# Patient Record
Sex: Female | Born: 1945 | Race: Black or African American | Hispanic: No | Smoking: Former smoker
Health system: Southern US, Community
[De-identification: ages and names within clinical notes are randomized; demographics above are authoritative.]

## PROBLEM LIST (undated history)

## (undated) DIAGNOSIS — I10 Essential (primary) hypertension: Secondary | ICD-10-CM

## (undated) DIAGNOSIS — K7460 Unspecified cirrhosis of liver: Secondary | ICD-10-CM

## (undated) HISTORY — PX: BLADDER SURGERY: SHX569

## (undated) HISTORY — PX: COLONOSCOPY: SHX174

---

## 1997-09-13 ENCOUNTER — Other Ambulatory Visit: Admission: RE | Admit: 1997-09-13 | Discharge: 1997-09-13 | Payer: Self-pay | Admitting: Radiology

## 1998-04-27 ENCOUNTER — Other Ambulatory Visit: Admission: RE | Admit: 1998-04-27 | Discharge: 1998-04-27 | Payer: Self-pay | Admitting: Obstetrics and Gynecology

## 1999-03-24 ENCOUNTER — Ambulatory Visit (HOSPITAL_COMMUNITY): Admission: RE | Admit: 1999-03-24 | Discharge: 1999-03-24 | Payer: Self-pay | Admitting: Internal Medicine

## 1999-03-24 ENCOUNTER — Encounter (INDEPENDENT_AMBULATORY_CARE_PROVIDER_SITE_OTHER): Payer: Self-pay | Admitting: Specialist

## 1999-06-23 ENCOUNTER — Encounter (INDEPENDENT_AMBULATORY_CARE_PROVIDER_SITE_OTHER): Payer: Self-pay | Admitting: Specialist

## 1999-06-23 ENCOUNTER — Other Ambulatory Visit: Admission: RE | Admit: 1999-06-23 | Discharge: 1999-06-23 | Payer: Self-pay | Admitting: Internal Medicine

## 1999-09-27 ENCOUNTER — Other Ambulatory Visit: Admission: RE | Admit: 1999-09-27 | Discharge: 1999-09-27 | Payer: Self-pay | Admitting: Obstetrics and Gynecology

## 2000-08-22 ENCOUNTER — Other Ambulatory Visit: Admission: RE | Admit: 2000-08-22 | Discharge: 2000-08-22 | Payer: Self-pay | Admitting: Obstetrics and Gynecology

## 2001-09-25 ENCOUNTER — Other Ambulatory Visit: Admission: RE | Admit: 2001-09-25 | Discharge: 2001-09-25 | Payer: Self-pay | Admitting: Obstetrics and Gynecology

## 2003-05-10 ENCOUNTER — Other Ambulatory Visit: Admission: RE | Admit: 2003-05-10 | Discharge: 2003-05-10 | Payer: Self-pay | Admitting: Obstetrics and Gynecology

## 2005-12-31 ENCOUNTER — Ambulatory Visit: Payer: Self-pay | Admitting: Internal Medicine

## 2006-01-29 ENCOUNTER — Encounter: Payer: Self-pay | Admitting: Internal Medicine

## 2006-01-29 ENCOUNTER — Ambulatory Visit: Payer: Self-pay | Admitting: Internal Medicine

## 2006-09-18 ENCOUNTER — Encounter: Admission: RE | Admit: 2006-09-18 | Discharge: 2006-09-18 | Payer: Self-pay | Admitting: Gastroenterology

## 2006-10-09 ENCOUNTER — Encounter (INDEPENDENT_AMBULATORY_CARE_PROVIDER_SITE_OTHER): Payer: Self-pay | Admitting: Specialist

## 2006-10-09 ENCOUNTER — Ambulatory Visit (HOSPITAL_COMMUNITY): Admission: RE | Admit: 2006-10-09 | Discharge: 2006-10-09 | Payer: Self-pay | Admitting: Gastroenterology

## 2006-10-30 ENCOUNTER — Encounter: Admission: RE | Admit: 2006-10-30 | Discharge: 2006-10-30 | Payer: Self-pay | Admitting: Gastroenterology

## 2008-10-28 ENCOUNTER — Encounter: Admission: RE | Admit: 2008-10-28 | Discharge: 2008-10-28 | Payer: Self-pay | Admitting: Gastroenterology

## 2010-06-25 ENCOUNTER — Encounter: Payer: Self-pay | Admitting: Gastroenterology

## 2011-02-08 ENCOUNTER — Encounter: Payer: Self-pay | Admitting: Internal Medicine

## 2011-05-15 ENCOUNTER — Emergency Department (HOSPITAL_COMMUNITY)
Admission: EM | Admit: 2011-05-15 | Discharge: 2011-05-15 | Disposition: A | Discharge status: Home / Self Care | Payer: No Typology Code available for payment source | Attending: Emergency Medicine | Admitting: Emergency Medicine

## 2011-05-15 ENCOUNTER — Emergency Department (HOSPITAL_COMMUNITY): Payer: No Typology Code available for payment source

## 2011-05-15 ENCOUNTER — Encounter: Payer: Self-pay | Admitting: *Deleted

## 2011-05-15 DIAGNOSIS — M25519 Pain in unspecified shoulder: Secondary | ICD-10-CM | POA: Insufficient documentation

## 2011-05-15 DIAGNOSIS — S0083XA Contusion of other part of head, initial encounter: Secondary | ICD-10-CM

## 2011-05-15 DIAGNOSIS — I10 Essential (primary) hypertension: Secondary | ICD-10-CM | POA: Insufficient documentation

## 2011-05-15 DIAGNOSIS — M549 Dorsalgia, unspecified: Secondary | ICD-10-CM | POA: Insufficient documentation

## 2011-05-15 DIAGNOSIS — R0602 Shortness of breath: Secondary | ICD-10-CM | POA: Insufficient documentation

## 2011-05-15 DIAGNOSIS — S161XXA Strain of muscle, fascia and tendon at neck level, initial encounter: Secondary | ICD-10-CM

## 2011-05-15 DIAGNOSIS — S0990XA Unspecified injury of head, initial encounter: Secondary | ICD-10-CM

## 2011-05-15 DIAGNOSIS — M542 Cervicalgia: Secondary | ICD-10-CM | POA: Insufficient documentation

## 2011-05-15 DIAGNOSIS — R51 Headache: Secondary | ICD-10-CM | POA: Insufficient documentation

## 2011-05-15 DIAGNOSIS — R079 Chest pain, unspecified: Secondary | ICD-10-CM | POA: Insufficient documentation

## 2011-05-15 HISTORY — DX: Essential (primary) hypertension: I10

## 2011-05-15 MED ORDER — HYDROCODONE-ACETAMINOPHEN 5-500 MG PO TABS: 1.0000 | ORAL_TABLET | Freq: Four times a day (QID) | ORAL | Status: AC | PRN

## 2011-05-15 NOTE — ED Notes (Signed)
[id number]<VW> Expected date:<BR> Expected time:<BR> Means of arrival:<BR> Comments:<BR> Ems/ fall. lsb
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
## 2011-11-30 ENCOUNTER — Other Ambulatory Visit: Payer: Self-pay | Admitting: Gastroenterology

## 2011-11-30 DIAGNOSIS — K7460 Unspecified cirrhosis of liver: Secondary | ICD-10-CM

## 2011-11-30 DIAGNOSIS — K759 Inflammatory liver disease, unspecified: Secondary | ICD-10-CM

## 2011-11-30 DIAGNOSIS — IMO0002 Reserved for concepts with insufficient information to code with codable children: Secondary | ICD-10-CM

## 2011-12-11 ENCOUNTER — Ambulatory Visit
Admission: RE | Admit: 2011-12-11 | Discharge: 2011-12-11 | Disposition: A | Discharge status: Home / Self Care | Payer: No Typology Code available for payment source | Source: Ambulatory Visit | Attending: Gastroenterology | Admitting: Gastroenterology

## 2011-12-11 DIAGNOSIS — IMO0002 Reserved for concepts with insufficient information to code with codable children: Secondary | ICD-10-CM

## 2012-03-05 ENCOUNTER — Encounter: Payer: Self-pay | Admitting: Internal Medicine

## 2012-07-23 ENCOUNTER — Other Ambulatory Visit: Payer: Self-pay | Admitting: Gastroenterology

## 2012-07-23 DIAGNOSIS — K7460 Unspecified cirrhosis of liver: Secondary | ICD-10-CM

## 2012-07-29 ENCOUNTER — Other Ambulatory Visit: Payer: Medicare Other

## 2012-08-05 ENCOUNTER — Other Ambulatory Visit: Payer: Medicare Other

## 2012-08-12 ENCOUNTER — Ambulatory Visit
Admission: RE | Admit: 2012-08-12 | Discharge: 2012-08-12 | Disposition: A | Discharge status: Home / Self Care | Payer: Medicare Other | Source: Ambulatory Visit | Attending: Gastroenterology | Admitting: Gastroenterology

## 2012-08-12 DIAGNOSIS — K7460 Unspecified cirrhosis of liver: Secondary | ICD-10-CM

## 2014-10-18 ENCOUNTER — Other Ambulatory Visit: Payer: Self-pay | Admitting: Family Medicine

## 2014-10-18 ENCOUNTER — Ambulatory Visit
Admission: RE | Admit: 2014-10-18 | Discharge: 2014-10-18 | Disposition: A | Discharge status: Home / Self Care | Payer: Medicare Other | Source: Ambulatory Visit | Attending: Family Medicine | Admitting: Family Medicine

## 2014-10-18 DIAGNOSIS — R1012 Left upper quadrant pain: Secondary | ICD-10-CM

## 2014-11-29 ENCOUNTER — Other Ambulatory Visit: Payer: Self-pay | Admitting: Gastroenterology

## 2014-11-29 DIAGNOSIS — K7460 Unspecified cirrhosis of liver: Secondary | ICD-10-CM

## 2014-12-03 ENCOUNTER — Other Ambulatory Visit: Payer: Medicare Other

## 2014-12-07 ENCOUNTER — Ambulatory Visit
Admission: RE | Admit: 2014-12-07 | Discharge: 2014-12-07 | Disposition: A | Discharge status: Home / Self Care | Payer: Medicare Other | Source: Ambulatory Visit | Attending: Gastroenterology | Admitting: Gastroenterology

## 2014-12-07 DIAGNOSIS — K7460 Unspecified cirrhosis of liver: Secondary | ICD-10-CM

## 2017-04-30 ENCOUNTER — Other Ambulatory Visit: Payer: Self-pay | Admitting: Gastroenterology

## 2017-04-30 DIAGNOSIS — K743 Primary biliary cirrhosis: Secondary | ICD-10-CM

## 2017-05-17 ENCOUNTER — Ambulatory Visit
Admission: RE | Admit: 2017-05-17 | Discharge: 2017-05-17 | Disposition: A | Discharge status: Home / Self Care | Payer: Medicare Other | Source: Ambulatory Visit | Attending: Gastroenterology | Admitting: Gastroenterology

## 2017-05-17 DIAGNOSIS — K743 Primary biliary cirrhosis: Secondary | ICD-10-CM

## 2018-01-04 ENCOUNTER — Ambulatory Visit (HOSPITAL_COMMUNITY)
Admission: EM | Admit: 2018-01-04 | Discharge: 2018-01-04 | Disposition: A | Discharge status: Home / Self Care | Payer: Medicare Other | Attending: Family Medicine | Admitting: Family Medicine

## 2018-01-04 ENCOUNTER — Other Ambulatory Visit: Payer: Self-pay

## 2018-01-04 ENCOUNTER — Encounter (HOSPITAL_COMMUNITY): Payer: Self-pay | Admitting: Emergency Medicine

## 2018-01-04 DIAGNOSIS — R51 Headache: Secondary | ICD-10-CM

## 2018-01-04 DIAGNOSIS — R202 Paresthesia of skin: Secondary | ICD-10-CM

## 2018-01-04 MED ORDER — NAPROXEN 500 MG PO TABS: 500.0000 mg | ORAL_TABLET | Freq: Two times a day (BID) | ORAL | 0 refills | Status: DC
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
## 2018-01-04 NOTE — Discharge Instructions (Signed)
No alarming signs on your exam. As discussed, xray will not show anything to your brain, and CT scan is to help determine if there is any brain bleed. You do not exhibit any symptoms that is worrisome for a brain bleed. Start naproxen as directed. This will help with inflammation, which can help with the tingling sensation to the back of the head. Ice compress, rest. Follow up with your PCP as scheduled for further evaluation needed.  Back  If experience numbness/tingling of the inner thighs, loss of bladder or bowel control, go to the emergency department for evaluation.   Head If experiencing worsening of symptoms, headache/blurry vision, nausea/vomiting, confusion/altered mental status, dizziness, weakness, passing out, imbalance, go to the emergency department for further evaluation.

## 2018-01-10 ENCOUNTER — Other Ambulatory Visit: Payer: Self-pay

## 2018-01-10 ENCOUNTER — Ambulatory Visit (HOSPITAL_COMMUNITY)
Admission: EM | Admit: 2018-01-10 | Discharge: 2018-01-10 | Disposition: A | Discharge status: Home / Self Care | Payer: Medicare Other | Attending: Family Medicine | Admitting: Family Medicine

## 2018-01-10 ENCOUNTER — Ambulatory Visit (INDEPENDENT_AMBULATORY_CARE_PROVIDER_SITE_OTHER): Payer: Medicare Other

## 2018-01-10 ENCOUNTER — Encounter (HOSPITAL_COMMUNITY): Payer: Self-pay

## 2018-01-10 DIAGNOSIS — R05 Cough: Secondary | ICD-10-CM

## 2018-01-10 DIAGNOSIS — I10 Essential (primary) hypertension: Secondary | ICD-10-CM

## 2018-01-10 DIAGNOSIS — K743 Primary biliary cirrhosis: Secondary | ICD-10-CM

## 2018-01-10 DIAGNOSIS — R059 Cough, unspecified: Secondary | ICD-10-CM

## 2018-01-10 DIAGNOSIS — H409 Unspecified glaucoma: Secondary | ICD-10-CM

## 2018-01-10 MED ORDER — BENZONATATE 200 MG PO CAPS: 200.0000 mg | ORAL_CAPSULE | Freq: Two times a day (BID) | ORAL | 0 refills | Status: DC | PRN

## 2018-01-10 MED ORDER — NAPROXEN 500 MG PO TABS: 500.0000 mg | ORAL_TABLET | Freq: Two times a day (BID) | ORAL | 0 refills | Status: DC

## 2018-01-10 NOTE — ED Triage Notes (Signed)
Coughing up white mucus. Left side flank pain. 4 days
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
## 2018-01-10 NOTE — Discharge Instructions (Addendum)
Drink plenty of fluids May continue Mucinex DM Take Tessalon twice a day for cough Take the naproxen 2 times a day as needed for the rib pain Take the naprosyn with food Drink plenty of water

## 2018-01-26 ENCOUNTER — Other Ambulatory Visit: Payer: Self-pay | Admitting: Family Medicine

## 2018-08-01 ENCOUNTER — Encounter: Payer: Self-pay | Admitting: Podiatry

## 2018-08-01 ENCOUNTER — Ambulatory Visit: Payer: Medicare Other | Admitting: Podiatry

## 2018-08-01 ENCOUNTER — Ambulatory Visit (INDEPENDENT_AMBULATORY_CARE_PROVIDER_SITE_OTHER): Payer: Medicare Other

## 2018-08-01 VITALS — BP 121/73

## 2018-08-01 DIAGNOSIS — M21619 Bunion of unspecified foot: Secondary | ICD-10-CM | POA: Diagnosis not present

## 2018-08-01 DIAGNOSIS — M2042 Other hammer toe(s) (acquired), left foot: Secondary | ICD-10-CM

## 2018-08-01 DIAGNOSIS — M2041 Other hammer toe(s) (acquired), right foot: Secondary | ICD-10-CM

## 2018-08-01 NOTE — Patient Instructions (Addendum)
Hammer Toe  Hammer toe is a change in the shape (a deformity) of your toe. The deformity causes the middle joint of your toe to stay bent. This causes pain, especially when you are wearing shoes. Hammer toe starts gradually. At first, the toe can be straightened. Gradually over time, the deformity becomes stiff and permanent. Early treatments to keep the toe straight may relieve pain. As the deformity becomes stiff and permanent, surgery may be needed to straighten the toe. What are the causes? Hammer toe is caused by abnormal bending of the toe joint that is closest to your foot. It happens gradually over time. This pulls on the muscles and connections (tendons) of the toe joint, making them weak and stiff. It is often related to wearing shoes that are too short or narrow and do not let your toes straighten. What increases the risk? You may be at greater risk for hammer toe if you:  Are female.  Are older.  Wear shoes that are too small.  Wear high-heeled shoes that pinch your toes.  Are a ballet dancer.  Have a second toe that is longer than your big toe (first toe).  Injure your foot or toe.  Have arthritis.  Have a family history of hammer toe.  Have a nerve or muscle disorder. What are the signs or symptoms? The main symptoms of this condition are pain and deformity of the toe. The pain is worse when wearing shoes, walking, or running. Other symptoms may include:  Corns or calluses over the bent part of the toe or between the toes.  Redness and a burning feeling on the toe.  An open sore that forms on the top of the toe.  Not being able to straighten the toe. How is this diagnosed? This condition is diagnosed based on your symptoms and a physical exam. During the exam, your health care provider will try to straighten your toe to see how stiff the deformity is. You may also have tests, such as:  A blood test to check for rheumatoid arthritis.  An X-ray to show how  severe the deformity is. How is this treated? Treatment for this condition will depend on how stiff the deformity is. Surgery is often needed. However, sometimes a hammer toe can be straightened without surgery. Treatments that do not involve surgery include:  Taping the toe into a straightened position.  Using pads and cushions to protect the toe (orthotics).  Wearing shoes that provide enough room for the toes.  Doing toe-stretching exercises at home.  Taking an NSAID to reduce pain and swelling. If these treatments do not help or the toe cannot be straightened, surgery is the next option. The most common surgeries used to straighten a hammer toe include:  Arthroplasty. In this procedure, part of the joint is removed, and that allows the toe to straighten.  Fusion. In this procedure, cartilage between the two bones of the joint is taken out and the bones are fused together into one longer bone.  Implantation. In this procedure, part of the bone is removed and replaced with an implant to let the toe move again.  Flexor tendon transfer. In this procedure, the tendons that curl the toes down (flexor tendons) are repositioned. Follow these instructions at home:  Take over-the-counter and prescription medicines only as told by your health care provider.  Do toe straightening and stretching exercises as told by your health care provider.  Keep all follow-up visits as told by your health care   provider. This is important. How is this prevented?  Wear shoes that give your toes enough room and do not cause pain.  Do not wear high-heeled shoes. Contact a health care provider if:  Your pain gets worse.  Your toe becomes red or swollen.  You develop an open sore on your toe. This information is not intended to replace advice given to you by your health care provider. Make sure you discuss any questions you have with your health care provider. Document Released: 05/18/2000 Document  Revised: 12/17/2016 Document Reviewed: 09/14/2015 Elsevier Interactive Patient Education  2019 [employer] Bunion  A bunion is a bump on the base of the big toe that forms when the bones of the big toe joint move out of position. Bunions may be small at first, but they often get larger over time. They can make walking painful. What are the causes? A bunion may be caused by:  Wearing narrow or pointed shoes that force the big toe to press against the other toes.  Abnormal foot development that causes the foot to roll inward (pronate).  Changes in the foot that are caused by certain diseases, such as rheumatoid arthritis or polio.  A foot injury. What increases the risk? The following factors may make you more likely to develop this condition:  Wearing shoes that squeeze the toes together.  Having certain diseases, such as: ? Rheumatoid arthritis. ? Polio. ? Cerebral palsy.  Having family members who have bunions.  Being born with a foot deformity, such as flat feet or low arches.  Doing activities that put a lot of pressure on the feet, such as ballet dancing. What are the signs or symptoms? The main symptom of a bunion is a noticeable bump on the big toe. Other symptoms may include:  Pain.  Swelling around the big toe.  Redness and inflammation.  Thick or hardened skin on the big toe or between the toes.  Stiffness or loss of motion in the big toe.  Trouble with walking. How is this diagnosed? A bunion may be diagnosed based on your symptoms, medical history, and activities. You may have tests, such as:  X-rays. These allow your health care provider to check the position of the bones in your foot and look for damage to your joint. They also help your health care provider determine the severity of your bunion and the best way to treat it.  Joint aspiration. In this test, a sample of fluid is removed from the toe joint. This test may be done if you are in a lot of  pain. It helps rule out diseases that cause painful swelling of the joints, such as arthritis. How is this treated? Treatment depends on the severity of your symptoms. The goal of treatment is to relieve symptoms and prevent the bunion from getting worse. Your health care provider may recommend:  Wearing shoes that have a wide toe box.  Using bunion pads to cushion the affected area.  Taping your toes together to keep them in a normal position.  Placing a device inside your shoe (orthotics) to help reduce pressure on your toe joint.  Taking medicine to ease pain, inflammation, and swelling.  Applying heat or ice to the affected area.  Doing stretching exercises.  Surgery to remove scar tissue and move the toes back into their normal position. This treatment is rare. Follow these instructions at home: Managing pain, stiffness, and swelling   If directed, put ice on the painful area: ?   Put ice in a plastic bag. ? Place a towel between your skin and the bag. ? Leave the ice on for 20 minutes, 2-3 times a day. Activity   If directed, apply heat to the affected area before you exercise. Use the heat source that your health care provider recommends, such as a moist heat pack or a heating pad. ? Place a towel between your skin and the heat source. ? Leave the heat on for 20-30 minutes. ? Remove the heat if your skin turns bright red. This is especially important if you are unable to feel pain, heat, or cold. You may have a greater risk of getting burned.  Do exercises as told by your health care provider. General instructions  Support your toe joint with proper footwear, shoe padding, or taping as told by your health care provider.  Take over-the-counter and prescription medicines only as told by your health care provider.  Keep all follow-up visits as told by your health care provider. This is important. Contact a health care provider if your symptoms:  Get worse.  Do not  improve in 2 weeks. Get help right away if you have:  Severe pain and trouble with walking. Summary  A bunion is a bump on the base of the big toe that forms when the bones of the big toe joint move out of position.  Bunions can make walking painful.  Treatment depends on the severity of your symptoms.  Support your toe joint with proper footwear, shoe padding, or taping as told by your health care provider. This information is not intended to replace advice given to you by your health care provider. Make sure you discuss any questions you have with your health care provider. Document Released: 05/21/2005 Document Revised: 10/01/2017 Document Reviewed: 10/01/2017 Elsevier Interactive Patient Education  2019 [employer]  

## 2018-08-02 NOTE — Progress Notes (Signed)
Subjective:   Patient ID: Stacey Harmon, female   DOB: 73 y.o.   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  Patient states is her hereditary and she is tried toe covers and shoe gear modification.  Patient does not smoke and likes to be active   Review of Systems  All other systems reviewed and are negative.       Objective:  Physical Exam Vitals signs and nursing note reviewed.  Constitutional:      Appearance: She is well-developed.  Pulmonary:     Effort: Pulmonary effort is normal.  Musculoskeletal: Normal range of motion.  Skin:    General: Skin is warm.  Neurological:     Mental Status: She is alert.     Neurovascular status intact muscle strength is adequate range of motion within normal limits with patient noted to have significant structural foot malalignment right with rigid contracture digits 2 and structural bunion deformity that is painful with palpation.  Patient is noted to have good digital perfusion and is well oriented x3     Assessment:  Significant structural deformity of the right foot with bunion deformity hammertoe deformity and moderate symptomatology     Plan:  H&P x-rays reviewed and discussed I would like to avoid surgery on her and I have recommended as possible digital fusion if necessary along with structural bunion correction we will not be able to get it completely corrected.  Patient wants to go this route at one point in future but at this time we will get a try conservative padding cushioning of the area and patient will be seen back depending on how symptoms do with conservative treatment  X-rays indicate there is significant structural bunion deformity with rigid contracture of the digit and bunion deformity noted

## 2018-10-08 ENCOUNTER — Ambulatory Visit: Payer: Medicare Other | Admitting: Podiatry

## 2018-10-08 ENCOUNTER — Encounter: Payer: Self-pay | Admitting: Podiatry

## 2018-10-08 ENCOUNTER — Ambulatory Visit (INDEPENDENT_AMBULATORY_CARE_PROVIDER_SITE_OTHER): Payer: Medicare Other

## 2018-10-08 ENCOUNTER — Other Ambulatory Visit: Payer: Self-pay

## 2018-10-08 VITALS — Temp 97.9°F

## 2018-10-08 DIAGNOSIS — S9031XA Contusion of right foot, initial encounter: Secondary | ICD-10-CM

## 2018-10-08 DIAGNOSIS — R600 Localized edema: Secondary | ICD-10-CM

## 2018-10-08 DIAGNOSIS — S92351A Displaced fracture of fifth metatarsal bone, right foot, initial encounter for closed fracture: Secondary | ICD-10-CM | POA: Diagnosis not present

## 2018-10-09 NOTE — Progress Notes (Signed)
Subjective:   Patient ID: Stacey Harmon, female   DOB: 73 y.o.   MRN: [id number]   HPI Patient presents stating she slipped on a curb on her right foot and felt a snap in her foot and it is been very swollen and painful.  States it is impossible for her to bear weight and she feels unstable   ROS      Objective:  Physical Exam  Neurovascular status intact with exquisite discomfort on the lateral side of the right foot base of fifth metatarsal with swelling noted around the entire area and negative Homans sign noted.  It is very tender when palpated it     Assessment:  Probability for fracture of the base of the fifth metatarsal right with edema of 8+2 pitting noted     Plan:  H&P x-rays reviewed and today applied Unna boot to reduce swelling along with Ace wrap instructed her to wear this for approximately 3 days but take it off earlier if needed and I applied air fracture walker to immobilize.  Patient will wear this and be seen back 3 weeks and will use crutches as needed  X-rays indicate fracture of the base of the fifth metatarsal right that is not Jones but more a evulsion

## 2018-10-29 ENCOUNTER — Other Ambulatory Visit: Payer: Self-pay

## 2018-10-29 ENCOUNTER — Ambulatory Visit (INDEPENDENT_AMBULATORY_CARE_PROVIDER_SITE_OTHER): Payer: Medicare Other

## 2018-10-29 ENCOUNTER — Encounter: Payer: Self-pay | Admitting: Podiatry

## 2018-10-29 ENCOUNTER — Ambulatory Visit: Payer: Medicare Other | Admitting: Podiatry

## 2018-10-29 VITALS — Temp 97.3°F

## 2018-10-29 DIAGNOSIS — S92351A Displaced fracture of fifth metatarsal bone, right foot, initial encounter for closed fracture: Secondary | ICD-10-CM

## 2018-10-29 DIAGNOSIS — M21619 Bunion of unspecified foot: Secondary | ICD-10-CM | POA: Diagnosis not present

## 2018-10-29 DIAGNOSIS — M2042 Other hammer toe(s) (acquired), left foot: Secondary | ICD-10-CM | POA: Diagnosis not present

## 2018-10-29 DIAGNOSIS — M2041 Other hammer toe(s) (acquired), right foot: Secondary | ICD-10-CM

## 2018-10-29 NOTE — Progress Notes (Signed)
Subjective:   Patient ID: Stacey Harmon, female   DOB: 73 y.o.   MRN: [id number]   HPI Patient states still having swelling and pain stating that the boot is helping her and it seems to gradually be improving   ROS      Objective:  Physical Exam  Neurovascular status intact with patient's right lateral fifth metatarsal showing swelling still present with moderate improvement from previous     Assessment:  Appears to be improving from fracture of the base of the fifth metatarsal right     Plan:  H&P discussed condition recommended continued immobilization reviewed x-rays and compression along with ice therapy.  Gradual reduction of boot starting the next 2 weeks with recovery hopefully occurring in the next 6 to 8 weeks  X-rays indicate fracture of the base of fifth metatarsal right which appears to be healing but still has a ways to go

## 2020-05-12 ENCOUNTER — Encounter: Payer: Self-pay | Admitting: Podiatry

## 2020-05-12 ENCOUNTER — Ambulatory Visit: Payer: Medicare Other | Admitting: Podiatry

## 2020-05-12 ENCOUNTER — Other Ambulatory Visit: Payer: Self-pay

## 2020-05-12 ENCOUNTER — Ambulatory Visit (INDEPENDENT_AMBULATORY_CARE_PROVIDER_SITE_OTHER): Payer: Medicare Other

## 2020-05-12 DIAGNOSIS — M2042 Other hammer toe(s) (acquired), left foot: Secondary | ICD-10-CM | POA: Diagnosis not present

## 2020-05-12 DIAGNOSIS — M2041 Other hammer toe(s) (acquired), right foot: Secondary | ICD-10-CM | POA: Diagnosis not present

## 2020-05-12 DIAGNOSIS — M779 Enthesopathy, unspecified: Secondary | ICD-10-CM | POA: Diagnosis not present

## 2020-05-12 DIAGNOSIS — S9032XA Contusion of left foot, initial encounter: Secondary | ICD-10-CM | POA: Diagnosis not present

## 2020-05-12 MED ORDER — TRIAMCINOLONE ACETONIDE 10 MG/ML IJ SUSP
10.0000 mg | Freq: Once | INTRAMUSCULAR | Status: AC
  Administered 2020-05-12: 10 mg

## 2020-05-13 NOTE — Progress Notes (Signed)
Subjective:   Patient ID: Stacey Harmon, female   DOB: 74 y.o.   MRN: [id number]   HPI Patient presents stating that she has concerns about pain in her arch of both feet and she states that she traumatized her left foot and she wants to have it checked.   ROS      Objective:  Physical Exam  Neurovascular status intact with patient noted to have contusion of the left foot with bruising of the arch moderate flatfoot deformity and digital deformities bilateral with keratotic lesion formation that is moderately painful     Assessment:  Fasciitis-like symptoms trauma with contusion and digital deformities     Plan:  H&P reviewed condition.  Upon extensive questioning it appears to be improving so I recommended that we just utilize over-the-counter insoles topical medicine and support along with soaks and the hammertoe deformity we discussed and I do not recommend current correction and would recommend wide toe box and softer materials.  Patient will be seen back to recheck if symptoms do not improve over the next 2 to 3 weeks  X-rays indicate that there is no indications of fracture of the foot with moderate depression of the arch

## 2020-12-21 ENCOUNTER — Other Ambulatory Visit: Payer: Self-pay | Admitting: Gastroenterology

## 2020-12-21 DIAGNOSIS — K743 Primary biliary cirrhosis: Secondary | ICD-10-CM

## 2021-01-04 DIAGNOSIS — W01198A Fall on same level from slipping, tripping and stumbling with subsequent striking against other object, initial encounter: Secondary | ICD-10-CM | POA: Diagnosis not present

## 2021-01-04 DIAGNOSIS — R918 Other nonspecific abnormal finding of lung field: Secondary | ICD-10-CM | POA: Insufficient documentation

## 2021-01-04 DIAGNOSIS — I10 Essential (primary) hypertension: Secondary | ICD-10-CM | POA: Insufficient documentation

## 2021-01-04 DIAGNOSIS — R0789 Other chest pain: Secondary | ICD-10-CM | POA: Diagnosis present

## 2021-01-04 DIAGNOSIS — M25562 Pain in left knee: Secondary | ICD-10-CM | POA: Insufficient documentation

## 2021-01-04 DIAGNOSIS — M25561 Pain in right knee: Secondary | ICD-10-CM | POA: Insufficient documentation

## 2021-01-04 DIAGNOSIS — Z87891 Personal history of nicotine dependence: Secondary | ICD-10-CM | POA: Diagnosis not present

## 2021-01-04 DIAGNOSIS — Z79899 Other long term (current) drug therapy: Secondary | ICD-10-CM | POA: Diagnosis not present

## 2021-01-05 ENCOUNTER — Emergency Department (HOSPITAL_COMMUNITY): Payer: Medicare Other

## 2021-01-05 ENCOUNTER — Other Ambulatory Visit: Payer: Self-pay

## 2021-01-05 ENCOUNTER — Encounter (HOSPITAL_COMMUNITY): Payer: Self-pay

## 2021-01-05 ENCOUNTER — Emergency Department (HOSPITAL_COMMUNITY)
Admission: EM | Admit: 2021-01-05 | Discharge: 2021-01-05 | Disposition: A | Discharge status: Home / Self Care | Payer: Medicare Other | Attending: Emergency Medicine | Admitting: Emergency Medicine

## 2021-01-05 DIAGNOSIS — R918 Other nonspecific abnormal finding of lung field: Secondary | ICD-10-CM

## 2021-01-05 DIAGNOSIS — M25562 Pain in left knee: Secondary | ICD-10-CM

## 2021-01-05 DIAGNOSIS — M25561 Pain in right knee: Secondary | ICD-10-CM

## 2021-01-05 DIAGNOSIS — R0789 Other chest pain: Secondary | ICD-10-CM

## 2021-01-05 LAB — BASIC METABOLIC PANEL
Anion gap: 10 (ref 5–15)
BUN: 14 mg/dL (ref 8–23)
CO2: 24 mmol/L (ref 22–32)
Calcium: 9.1 mg/dL (ref 8.9–10.3)
Chloride: 106 mmol/L (ref 98–111)
Creatinine, Ser: 0.58 mg/dL (ref 0.44–1.00)
GFR, Estimated: >60 mL/min (ref >60)
Glucose, Bld: 99 mg/dL (ref 70–99)
Potassium: 3.9 mmol/L (ref 3.5–5.1)
Sodium: 140 mmol/L (ref 135–145)

## 2021-01-05 LAB — CBC WITH DIFFERENTIAL/PLATELET
Abs Immature Granulocytes: 0.03 10*3/uL (ref 0.00–0.07)
Basophils Absolute: 0 10*3/uL (ref 0.0–0.1)
Basophils Relative: 0 %
Eosinophils Absolute: 0.1 10*3/uL (ref 0.0–0.5)
Eosinophils Relative: 1 %
HCT: 35.2 % — ABNORMAL LOW (ref 36.0–46.0)
Hemoglobin: 11.7 g/dL — ABNORMAL LOW (ref 12.0–15.0)
Immature Granulocytes: 0 %
Lymphocytes Relative: 13 %
Lymphs Abs: 1.2 10*3/uL (ref 0.7–4.0)
MCH: 33.7 pg (ref 26.0–34.0)
MCHC: 33.2 g/dL (ref 30.0–36.0)
MCV: 101.4 fL — ABNORMAL HIGH (ref 80.0–100.0)
Monocytes Absolute: 0.9 10*3/uL (ref 0.1–1.0)
Monocytes Relative: 10 %
Neutro Abs: 7 10*3/uL (ref 1.7–7.7)
Neutrophils Relative %: 76 %
Platelets: 260 10*3/uL (ref 150–400)
RBC: 3.47 MIL/uL — ABNORMAL LOW (ref 3.87–5.11)
RDW: 14.7 % (ref 11.5–15.5)
WBC: 9.3 10*3/uL (ref 4.0–10.5)
nRBC: 0 % (ref 0.0–0.2)

## 2021-01-05 MED ORDER — OXYCODONE-ACETAMINOPHEN 5-325 MG PO TABS
1.0000 | ORAL_TABLET | Freq: Four times a day (QID) | ORAL | 0 refills | Status: DC | PRN
Start: 2021-01-05 — End: 2021-01-12

## 2021-01-05 MED ORDER — OXYCODONE-ACETAMINOPHEN 5-325 MG PO TABS
1.0000 | ORAL_TABLET | Freq: Once | ORAL | Status: AC
Start: 2021-01-05 — End: 2021-01-05
  Administered 2021-01-05: 1 via ORAL
  Filled 2021-01-05: qty 1

## 2021-01-05 MED ORDER — IOHEXOL 350 MG/ML SOLN
100.0000 mL | Freq: Once | INTRAVENOUS | Status: AC | PRN
  Administered 2021-01-05: 75 mL via INTRAVENOUS

## 2021-01-05 NOTE — ED Provider Notes (Signed)
[facility] DEPT Provider Note   CSN: [id number] Arrival date & time: 01/04/21  2352     History Chief Complaint  Patient presents with   Fall   Rib Injury   Knee Pain    Stacey Harmon is a 75 y.o. female.  HPI     This is a 75 year old female with a history of hypertension who presents with chest wall pain and knee pain.  Patient reports that she tripped and fell yesterday.  She states she fell on a tile floor.  She has had progressively worsening anterior chest discomfort and bilateral knee pain.  She has been ambulatory.  She states she has to sit forward to be comfortable with breathing.  She rates her discomfort as 6 out of 10.  No abdominal pain, nausea, vomiting.  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 No pertinent surgical history.   OB History   No obstetric history on file.     History reviewed. No pertinent family history.  Social History   Tobacco Use   Smoking status: Former   Smokeless tobacco: Former  Substance Use Topics   Alcohol use: Never   Drug use: Never    Home Medications Prior to Admission medications   Medication Sig Start Date End Date Taking? Authorizing Provider  oxyCODONE-acetaminophen (PERCOCET/ROXICET) 5-325 MG tablet Take 1 tablet by mouth every 6 (six) hours as needed for severe pain. 01/05/21  Yes Franny Selvage, Barbette Hair, MD  ALPRAZolam (XANAX XR) 0.5 MG 24 hr tablet Take 0.5 mg by mouth at bedtime.      [provider]  ALPRAZolam Duanne Moron) 0.5 MG tablet TK 1 T PO  TID 07/08/18   [provider]  amLODipine (NORVASC) 10 MG tablet Take 10 mg by mouth daily.      [provider]  azaTHIOprine (IMURAN) 50 MG tablet Take 125 mg by mouth daily.     [provider]  benzonatate  (TESSALON) 200 MG capsule Take 1 capsule (200 mg total) by mouth 2 (two) times daily as needed for cough. 01/10/18   Raylene Everts, MD  brimonidine-timolol (COMBIGAN) 0.2-0.5 % ophthalmic solution Place 1 drop into both eyes every 12 (twelve) hours.    [provider]  clindamycin (CLEOCIN) 300 MG capsule Take 300 mg by mouth every 8 (eight) hours. 02/29/20   [provider]  flavoxATE (URISPAS) 100 MG tablet Take 100 mg by mouth 3 (three) times daily. 11/17/19   [provider]  latanoprost (XALATAN) 0.005 % ophthalmic solution Place 1 drop into both eyes at bedtime.      [provider]  Multiple Vitamins-Calcium (ONE-A-DAY WOMENS PO) Take 1 tablet by mouth daily.      [provider]  naproxen (NAPROSYN) 500 MG tablet Take 1 tablet (500 mg total) by mouth 2 (two) times daily. 01/10/18   Raylene Everts, MD  NYSTATIN powder Apply topically 3 (three) times daily. 11/23/19   [provider]  predniSONE (DELTASONE) 10 MG tablet Take 10 mg by mouth daily.      [provider]  triamterene-hydrochlorothiazide (MAXZIDE-25) 37.5-25 MG per tablet Take 2 tablets by mouth daily.      [provider]    Allergies    Penicillins  Review of Systems  Review of Systems  Constitutional:  Negative for fever.  Respiratory:  Positive for shortness of breath.   Cardiovascular:  Positive for chest pain. Negative for leg swelling.  Gastrointestinal:  Negative for abdominal pain, nausea and vomiting.  Genitourinary:  Negative for dysuria.  Musculoskeletal:        Knee pain  All other systems reviewed and are negative.  Physical Exam Updated Vital Signs BP 114/70   Pulse 79   Temp 98.2 F (36.8 C) (Oral)   Resp 18   Ht 1.6 m (5\' 3" )   Wt 62.6 kg   SpO2 96%   BMI 24.45 kg/m   Physical Exam Vitals and nursing note reviewed.  Constitutional:      Appearance: She is well-developed. She is not ill-appearing.  HENT:     Head:  Normocephalic and atraumatic.     Nose: Nose normal.     Mouth/Throat:     Mouth: Mucous membranes are moist.  Eyes:     Pupils: Pupils are equal, round, and reactive to light.  Cardiovascular:     Rate and Rhythm: Normal rate and regular rhythm.     Heart sounds: Normal heart sounds.  Pulmonary:     Effort: Pulmonary effort is normal. No respiratory distress.     Breath sounds: No wheezing.     Comments: No crepitus Chest:     Chest wall: Tenderness present.  Abdominal:     Palpations: Abdomen is soft.     Tenderness: There is no abdominal tenderness.  Musculoskeletal:     Cervical back: Neck supple.     Comments: Normal range of motion of the bilateral knees, no effusions noted, no obvious deformities  Skin:    General: Skin is warm and dry.  Neurological:     Mental Status: She is alert and oriented to person, place, and time.  Psychiatric:        Mood and Affect: Mood normal.    ED Results / Procedures / Treatments   Labs (all labs ordered are listed, but only abnormal results are displayed) Labs Reviewed  CBC WITH DIFFERENTIAL/PLATELET - Abnormal; Notable for the following components:      Result Value   RBC 3.47 (*)    Hemoglobin 11.7 (*)    HCT 35.2 (*)    MCV 101.4 (*)    All other components within normal limits  BASIC METABOLIC PANEL    EKG None  Radiology DG Ribs Unilateral W/Chest Left  Result Date: 01/05/2021 CLINICAL DATA:  Left rib pain, fall EXAM: LEFT RIBS AND CHEST - 3+ VIEW COMPARISON:  01/10/2018 FINDINGS: The lungs are symmetrically well expanded. Since prior examination, there has developed a 4.3 x 3.4 cm mass within the left lower lobe. Subcentimeter nodular density is seen within the left upper lobe, nonspecific. The lungs are otherwise clear. No pneumothorax or pleural effusion. Cardiac size within normal limits. Pulmonary vascularity is normal. No acute bone abnormality. Specifically, no acute rib fracture identified. IMPRESSION: No acute rib  fracture. Interval development of a 4.3 cm left lower lobe pulmonary mass. Dedicated nonemergent contrast enhanced CT imaging of the chest is recommended for further evaluation. Left apical nodular density can be simultaneously assessed. Electronically Signed   By: Fidela Salisbury MD   On: 01/05/2021 02:14   CT Chest W Contrast  Result Date: 01/05/2021 CLINICAL DATA:  Abnormal chest x-ray.  Presentation for fall EXAM: CT CHEST WITH CONTRAST TECHNIQUE: Multidetector CT imaging of the chest was performed during intravenous contrast  administration. CONTRAST:  54mL OMNIPAQUE IOHEXOL 350 MG/ML SOLN COMPARISON:  Radiography from earlier the same day FINDINGS: Cardiovascular: Normal heart size. No pericardial effusion. Aortic and coronary atherosclerosis. No acute vascular finding Mediastinum/Nodes: Heterogeneous enlargement of a left hilar node measuring 2 cm. Nonenlarged but centrally low-density subcarinal node measuring 15 mm. 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 Lungs/Pleura: Confirmed mass in the left lower lobe measuring up to 3.7 cm with central low-density appearance similar to the enlarged nodes. Solid and subsolid 14 mm subpleural nodule in the left upper lobe. Solid and subsolid 12 mm nodule with adjacent solid 5 mm nodule in the right upper lobe along the fissure. Tiny apical pulmonary nodule is noted in the right lung. Airway thickening with some right lower lobe mucoid impaction and atelectasis. Centrilobular emphysema Upper Abdomen: No evidence of mass or acute disease. Musculoskeletal: No noted metastatic disease or fracture. IMPRESSION: 1. 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 There is left hilar and subcarinal adenopathy. Recommend referral to multi disciplinary thoracic oncology service. 2. Suspicious mixed density nodules in the bilateral upper lobes. 3. Aortic Atherosclerosis (ICD10-I70.0) and Emphysema (ICD10-J43.9). Electronically Signed    By: Monte Fantasia M.D.   On: 01/05/2021 05:11   DG Knee Complete 4 Views Left  Result Date: 01/05/2021 CLINICAL DATA:  Bilateral knee pain EXAM: LEFT KNEE - COMPLETE 4+ VIEW COMPARISON:  None. FINDINGS: Normal alignment. No fracture or dislocation. Mild medial compartment joint space narrowing in keeping with minimal degenerative arthritis in this location. No effusion. Soft tissues are unremarkable. IMPRESSION: Minimal medial compartment degenerative arthritis. Electronically Signed   By: Fidela Salisbury MD   On: 01/05/2021 02:10   DG Knee Complete 4 Views Right  Result Date: 01/05/2021 CLINICAL DATA:  Bilateral knee pain EXAM: RIGHT KNEE - COMPLETE 4+ VIEW COMPARISON:  None. FINDINGS: Normal alignment. No fracture or dislocation. Mild degenerative chondrocalcinosis of the medial and lateral menisci. No effusion. Soft tissues are unremarkable. IMPRESSION: Mild degenerative chondrocalcinosis. No acute fracture or dislocation. Electronically Signed   By: Fidela Salisbury MD   On: 01/05/2021 02:10    Procedures Procedures   Medications Ordered in ED Medications  oxyCODONE-acetaminophen (PERCOCET/ROXICET) 5-325 MG per tablet 1 tablet (has no administration in time range)  iohexol (OMNIPAQUE) 350 MG/ML injection 100 mL (75 mLs Intravenous Contrast Given 01/05/21 0440)    ED Course  I have reviewed the triage vital signs and the nursing notes.  Pertinent labs & imaging results that were available during my care of the patient were reviewed by me and considered in my medical decision making (see chart for details).    MDM Rules/Calculators/A&P                           Patient presents with chest wall pain and knee pain.  She reports onset of symptoms after a fall.  She is overall nontoxic and vital signs are reassuring.  She has reproducible pain on exam.  No overlying skin changes or crepitus.  X-rays obtained.  Unfortunately x-ray is revealing for possible lung mass.  No rib fractures or  pneumothorax.  Bilateral knee films without fracture or dislocation.  Further work-up initiated given concern for lung mass.  CT scan obtained and confirms almost 4 cm lung mass concerning for cancer.  I discussed these findings with the patient.  She is a former smoker.  Spoke with Dr. Julien Nordmann, oncology.  We will arrange for follow-up.  Patient is not hypoxic and is otherwise stable.  Feel she can follow-up as an outpatient.  After history, exam, and medical workup I feel the patient has been appropriately medically screened and is safe for discharge home. Pertinent diagnoses were discussed with the patient. Patient was given return precautions.  Final Clinical Impression(s) / ED Diagnoses Final diagnoses:  Lung mass  Acute pain of both knees  Chest wall pain    Rx / DC Orders ED Discharge Orders          Ordered    oxyCODONE-acetaminophen (PERCOCET/ROXICET) 5-325 MG tablet  Every 6 hours PRN        01/05/21 0548             Merryl Hacker, MD 01/05/21 [phone]

## 2021-01-05 NOTE — ED Triage Notes (Signed)
Pt reports falling yesterday evening around 1700. Pt states that she fell on both knees and hit her left side hurting her ribs.

## 2021-01-05 NOTE — ED Provider Notes (Signed)
Emergency Medicine Provider Triage Evaluation Note  Stacey Harmon , a 75 y.o. female  was evaluated in triage.  Pt complains of fall with left rib pain, bilateral knee pain.  Fell last night around 5 PM.  Has been ambulatory since.  Review of Systems  Positive: Chest/rib pain, knee pain Negative: SOB  Physical Exam  BP (!) 146/84 (BP Location: Left Arm)   Pulse 84   Temp 98.2 F (36.8 C) (Oral)   Resp 18   Ht 1.6 m (5\' 3" )   Wt 62.6 kg   SpO2 98%   BMI 24.45 kg/m  Gen:   Awake, no distress   Resp:  Normal effort  MSK:   Moves extremities without difficulty  Other:  TTP anterior chest wall  Medical Decision Making  Medically screening exam initiated at 1:43 AM.  Appropriate orders placed.  DABNEY DEVER was informed that the remainder of the evaluation will be completed by another provider, this initial triage assessment does not replace that evaluation, and the importance of remaining in the ED until their evaluation is complete.     Merryl Hacker, MD 01/05/21 [phone]

## 2021-01-05 NOTE — Discharge Instructions (Addendum)
You were seen today after a fall.  During her work-up, you were found to have a lung mass.  Your imaging is concerning for cancer.  Follow-up with oncology.  Dr. Worthy Flank office will call you for follow-up.  If you do not receive a phone call, follow-up as above.  Regarding your knee pain, your x-rays are negative.  Take pain medication and keep iced and elevated.

## 2021-01-06 ENCOUNTER — Encounter: Payer: Self-pay | Admitting: *Deleted

## 2021-01-10 ENCOUNTER — Other Ambulatory Visit: Payer: Self-pay | Admitting: *Deleted

## 2021-01-10 DIAGNOSIS — R918 Other nonspecific abnormal finding of lung field: Secondary | ICD-10-CM

## 2021-01-11 ENCOUNTER — Other Ambulatory Visit: Payer: Self-pay | Admitting: *Deleted

## 2021-01-12 ENCOUNTER — Encounter: Payer: Self-pay | Admitting: *Deleted

## 2021-01-12 ENCOUNTER — Inpatient Hospital Stay: Payer: Medicare Other | Attending: Internal Medicine | Admitting: Internal Medicine

## 2021-01-12 ENCOUNTER — Other Ambulatory Visit: Payer: Self-pay

## 2021-01-12 ENCOUNTER — Encounter: Payer: Self-pay | Admitting: Internal Medicine

## 2021-01-12 ENCOUNTER — Ambulatory Visit
Admission: RE | Admit: 2021-01-12 | Discharge: 2021-01-12 | Disposition: A | Discharge status: Home / Self Care | Payer: Medicare Other | Source: Ambulatory Visit | Attending: Radiation Oncology | Admitting: Radiation Oncology

## 2021-01-12 ENCOUNTER — Inpatient Hospital Stay: Payer: Medicare Other

## 2021-01-12 ENCOUNTER — Other Ambulatory Visit: Payer: Self-pay | Admitting: *Deleted

## 2021-01-12 VITALS — BP 125/75 | HR 73 | Temp 97.9°F | Resp 19 | Ht 63.0 in | Wt 138.1 lb

## 2021-01-12 DIAGNOSIS — C3490 Malignant neoplasm of unspecified part of unspecified bronchus or lung: Secondary | ICD-10-CM

## 2021-01-12 DIAGNOSIS — I10 Essential (primary) hypertension: Secondary | ICD-10-CM

## 2021-01-12 DIAGNOSIS — R918 Other nonspecific abnormal finding of lung field: Secondary | ICD-10-CM

## 2021-01-12 DIAGNOSIS — Z87891 Personal history of nicotine dependence: Secondary | ICD-10-CM | POA: Insufficient documentation

## 2021-01-12 DIAGNOSIS — C3432 Malignant neoplasm of lower lobe, left bronchus or lung: Secondary | ICD-10-CM

## 2021-01-12 LAB — CBC WITH DIFFERENTIAL (CANCER CENTER ONLY)
Abs Immature Granulocytes: 0.01 10*3/uL (ref 0.00–0.07)
Basophils Absolute: 0.1 10*3/uL (ref 0.0–0.1)
Basophils Relative: 1 %
Eosinophils Absolute: 0.1 10*3/uL (ref 0.0–0.5)
Eosinophils Relative: 1 %
HCT: 36.8 % (ref 36.0–46.0)
Hemoglobin: 12 g/dL (ref 12.0–15.0)
Immature Granulocytes: 0 %
Lymphocytes Relative: 13 %
Lymphs Abs: 0.9 10*3/uL (ref 0.7–4.0)
MCH: 32.7 pg (ref 26.0–34.0)
MCHC: 32.6 g/dL (ref 30.0–36.0)
MCV: 100.3 fL — ABNORMAL HIGH (ref 80.0–100.0)
Monocytes Absolute: 0.4 10*3/uL (ref 0.1–1.0)
Monocytes Relative: 6 %
Neutro Abs: 5.6 10*3/uL (ref 1.7–7.7)
Neutrophils Relative %: 79 %
Platelet Count: 262 10*3/uL (ref 150–400)
RBC: 3.67 MIL/uL — ABNORMAL LOW (ref 3.87–5.11)
RDW: 14.6 % (ref 11.5–15.5)
WBC Count: 7 10*3/uL (ref 4.0–10.5)
nRBC: 0 % (ref 0.0–0.2)

## 2021-01-12 LAB — CMP (CANCER CENTER ONLY)
ALT: 29 U/L (ref 0–44)
AST: 32 U/L (ref 15–41)
Albumin: 3.3 g/dL — ABNORMAL LOW (ref 3.5–5.0)
Alkaline Phosphatase: 42 U/L (ref 38–126)
Anion gap: 6 (ref 5–15)
BUN: 17 mg/dL (ref 8–23)
CO2: 26 mmol/L (ref 22–32)
Calcium: 9.3 mg/dL (ref 8.9–10.3)
Chloride: 108 mmol/L (ref 98–111)
Creatinine: 0.77 mg/dL (ref 0.44–1.00)
GFR, Estimated: >60 mL/min (ref >60)
Glucose, Bld: 133 mg/dL — ABNORMAL HIGH (ref 70–99)
Potassium: 4.6 mmol/L (ref 3.5–5.1)
Sodium: 140 mmol/L (ref 135–145)
Total Bilirubin: 1.1 mg/dL (ref 0.3–1.2)
Total Protein: 6.5 g/dL (ref 6.5–8.1)

## 2021-01-12 NOTE — Progress Notes (Signed)
The proposed treatment discussed in cancer conference is for discussion purpose only and is not a binding recommendation. The patient was not physically examined nor present for their treatment options. Therefore, final treatment plans cannot be decided.  ?

## 2021-01-12 NOTE — Progress Notes (Signed)
Oncology Nurse Navigator Documentation  Oncology Nurse Navigator Flowsheets 01/12/2021  Abnormal Finding Date 01/05/2021  Diagnosis Status Additional Work Up  Navigator Follow Up Date: 01/16/2021  Navigator Follow Up Reason: Appointment Review  Navigator Location [facility]  Navigator Encounter Type Clinic/MDC;Initial [facility] Date 01/12/2021  Multidisiplinary Clinic Type Thoracic  Patient Visit Type Initial;MedOnc  Treatment Phase Abnormal Scans  Barriers/Navigation Needs Coordination of Care;Education  Education Other  Interventions Education;Psycho-Social Support  Acuity Level 2-Minimal Needs (1-2 Barriers Identified)  Education Method Verbal  Time Spent with Patient 30

## 2021-01-12 NOTE — Progress Notes (Signed)
[name] [phone]   [phone] Multidisciplinary thoracic oncology CONSULT NOTE  REFERRING PHYSICIAN: Dr. Thayer Jew, ED  REASON FOR CONSULTATION:  74 years old white female with suspicious lung cancer.  HPI Stacey Harmon is a 75 y.o. female with past medical history significant for hypertension and liver cirrhosis diagnosed in 2008 followed by Dr. Benson Norway.  The patient also has a long history for smoking but quit few months ago.  She tripped in a chair at home and presented to the emergency department on 01/05/2021 complaining of pain at the lower rib cage especially on the left side.  During her evaluation she had x-ray of the left ribs and chest and it showed interval development of a 4.3 cm left lower lobe pulmonary mass.  This was followed by CT scan of the chest with contrast on the same day and that showed a mass in the left lower lobe measuring up to 3.7 cm with central low-density appearance similar to the enlarged nodes that are present in the left hilar measuring 2.0 cm.  There was also an enlarged but centrally low-density subcarinal node measuring 1.5 cm.  The scan also showed solid and soft solid 1.4 cm subpleural nodule in the left upper lobe and solid and soft solid 1.2 cm nodule with adjacent 0.5 cm nodule in the right upper lobe along the fissure.  The patient was referred from the emergency department to the multidisciplinary thoracic oncology clinic today for evaluation and recommendation regarding her condition. When seen today she is feeling fine except for mild cough and pain and the lower rib cage bilaterally.  She denied having any shortness of breath or hemoptysis.  She denied having any fever or chills.  She has no nausea, vomiting, diarrhea or constipation.  The patient denied having any headache or visual changes. Family history significant for mother with a stroke.  Father had rectal cancer and brother had stomach cancer. The  patient is single and has 3 sons.  She works as a Education officer, [employer].  She has a history for smoking less than 1 pack/day for around 60 years and quit few months ago.  She has no history of alcohol or drug abuse.  HPI  Past Medical History:  Diagnosis Date   Hypertension     No past surgical history on file.  No family history on file.  Social History Social History   Tobacco Use   Smoking status: Former   Smokeless tobacco: Former  Substance Use Topics   Alcohol use: Never   Drug use: Never    Allergies  Allergen Reactions   Penicillins Swelling    Current Outpatient Medications  Medication Sig Dispense Refill   ALPRAZolam (XANAX XR) 0.5 MG 24 hr tablet Take 0.5 mg by mouth at bedtime.       ALPRAZolam (XANAX) 0.5 MG tablet TK 1 T PO  TID     amLODipine (NORVASC) 10 MG tablet Take 10 mg by mouth daily.       azaTHIOprine (IMURAN) 50 MG tablet Take 125 mg by mouth daily.      benzonatate (TESSALON) 200 MG capsule Take 1 capsule (200 mg total) by mouth 2 (two) times daily as needed for cough. 20 capsule 0   brimonidine-timolol (COMBIGAN) 0.2-0.5 % ophthalmic solution Place 1 drop into both eyes every 12 (twelve) hours.     clindamycin (CLEOCIN) 300 MG capsule Take 300 mg by mouth every 8 (eight) hours.  flavoxATE (URISPAS) 100 MG tablet Take 100 mg by mouth 3 (three) times daily.     latanoprost (XALATAN) 0.005 % ophthalmic solution Place 1 drop into both eyes at bedtime.       Multiple Vitamins-Calcium (ONE-A-DAY WOMENS PO) Take 1 tablet by mouth daily.       naproxen (NAPROSYN) 500 MG tablet Take 1 tablet (500 mg total) by mouth 2 (two) times daily. 30 tablet 0   NYSTATIN powder Apply topically 3 (three) times daily.     oxybutynin (DITROPAN) 5 MG tablet Take 5 mg by mouth daily.     oxyCODONE-acetaminophen (PERCOCET/ROXICET) 5-325 MG tablet Take 1 tablet by mouth every 6 (six) hours as needed for severe pain. 6 tablet 0   predniSONE (DELTASONE) 10 MG tablet Take 10 mg by  mouth daily.       triamterene-hydrochlorothiazide (MAXZIDE-25) 37.5-25 MG per tablet Take 2 tablets by mouth daily.       No current facility-administered medications for this visit.    Review of Systems  Constitutional: positive for fatigue Eyes: negative Ears, nose, mouth, throat, and face: negative Respiratory: positive for cough and pleurisy/chest pain Cardiovascular: negative Gastrointestinal: negative Genitourinary:negative Integument/breast: negative Hematologic/lymphatic: negative Musculoskeletal:positive for lower rib cage pain Neurological: negative Behavioral/Psych: negative Endocrine: negative Allergic/Immunologic: negative  Physical Exam  TFT:DDUKG, healthy, no distress, well nourished, and well developed SKIN: skin color, texture, turgor are normal, no rashes or significant lesions HEAD: Normocephalic, No masses, lesions, tenderness or abnormalities EYES: normal, PERRLA, Conjunctiva are pink and non-injected EARS: External ears normal, Canals clear OROPHARYNX:no exudate, no erythema, and lips, buccal mucosa, and tongue normal  NECK: supple, no adenopathy, no JVD LYMPH:  no palpable lymphadenopathy, no hepatosplenomegaly BREAST:not examined LUNGS: clear to auscultation , and palpation HEART: regular rate & rhythm, no murmurs, and no gallops ABDOMEN:abdomen soft, non-tender, normal bowel sounds, and no masses or organomegaly BACK: No CVA tenderness, Range of motion is normal EXTREMITIES:no joint deformities, effusion, or inflammation, no edema  NEURO: alert & oriented x 3 with fluent speech, no focal motor/sensory deficits  PERFORMANCE STATUS: ECOG 1  LABORATORY DATA: Lab Results  Component Value Date   WBC 7.0 01/12/2021   HGB 12.0 01/12/2021   HCT 36.8 01/12/2021   MCV 100.3 (H) 01/12/2021   PLT 262 01/12/2021      Chemistry      Component Value Date/Time   NA 140 01/12/2021 1255   K 4.6 01/12/2021 1255   CL 108 01/12/2021 1255   CO2 26  01/12/2021 1255   BUN 17 01/12/2021 1255   CREATININE 0.77 01/12/2021 1255      Component Value Date/Time   CALCIUM 9.3 01/12/2021 1255   ALKPHOS 42 01/12/2021 1255   AST 32 01/12/2021 1255   ALT 29 01/12/2021 1255   BILITOT 1.1 01/12/2021 1255       RADIOGRAPHIC STUDIES: DG Ribs Unilateral W/Chest Left  Result Date: 01/05/2021 CLINICAL DATA:  Left rib pain, fall EXAM: LEFT RIBS AND CHEST - 3+ VIEW COMPARISON:  01/10/2018 FINDINGS: The lungs are symmetrically well expanded. Since prior examination, there has developed a 4.3 x 3.4 cm mass within the left lower lobe. Subcentimeter nodular density is seen within the left upper lobe, nonspecific. The lungs are otherwise clear. No pneumothorax or pleural effusion. Cardiac size within normal limits. Pulmonary vascularity is normal. No acute bone abnormality. Specifically, no acute rib fracture identified. IMPRESSION: No acute rib fracture. Interval development of a 4.3 cm left lower lobe pulmonary mass. Dedicated nonemergent  contrast enhanced CT imaging of the chest is recommended for further evaluation. Left apical nodular density can be simultaneously assessed. Electronically Signed   By: Fidela Salisbury MD   On: 01/05/2021 02:14   CT Chest W Contrast  Result Date: 01/05/2021 CLINICAL DATA:  Abnormal chest x-ray.  Presentation for fall EXAM: CT CHEST WITH CONTRAST TECHNIQUE: Multidetector CT imaging of the chest was performed during intravenous contrast administration. CONTRAST:  40mL OMNIPAQUE IOHEXOL 350 MG/ML SOLN COMPARISON:  Radiography from earlier the same day FINDINGS: Cardiovascular: Normal heart size. No pericardial effusion. Aortic and coronary atherosclerosis. No acute vascular finding Mediastinum/Nodes: Heterogeneous enlargement of a left hilar node measuring 2 cm. Nonenlarged but centrally low-density subcarinal node measuring 15 mm. 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 Lungs/Pleura: Confirmed mass in the  left lower lobe measuring up to 3.7 cm with central low-density appearance similar to the enlarged nodes. Solid and subsolid 14 mm subpleural nodule in the left upper lobe. Solid and subsolid 12 mm nodule with adjacent solid 5 mm nodule in the right upper lobe along the fissure. Tiny apical pulmonary nodule is noted in the right lung. Airway thickening with some right lower lobe mucoid impaction and atelectasis. Centrilobular emphysema Upper Abdomen: No evidence of mass or acute disease. Musculoskeletal: No noted metastatic disease or fracture. IMPRESSION: 1. 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 There is left hilar and subcarinal adenopathy. Recommend referral to multi disciplinary thoracic oncology service. 2. Suspicious mixed density nodules in the bilateral upper lobes. 3. Aortic Atherosclerosis (ICD10-I70.0) and Emphysema (ICD10-J43.9). Electronically Signed   By: Monte Fantasia M.D.   On: 01/05/2021 05:11   DG Knee Complete 4 Views Left  Result Date: 01/05/2021 CLINICAL DATA:  Bilateral knee pain EXAM: LEFT KNEE - COMPLETE 4+ VIEW COMPARISON:  None. FINDINGS: Normal alignment. No fracture or dislocation. Mild medial compartment joint space narrowing in keeping with minimal degenerative arthritis in this location. No effusion. Soft tissues are unremarkable. IMPRESSION: Minimal medial compartment degenerative arthritis. Electronically Signed   By: Fidela Salisbury MD   On: 01/05/2021 02:10   DG Knee Complete 4 Views Right  Result Date: 01/05/2021 CLINICAL DATA:  Bilateral knee pain EXAM: RIGHT KNEE - COMPLETE 4+ VIEW COMPARISON:  None. FINDINGS: Normal alignment. No fracture or dislocation. Mild degenerative chondrocalcinosis of the medial and lateral menisci. No effusion. Soft tissues are unremarkable. IMPRESSION: Mild degenerative chondrocalcinosis. No acute fracture or dislocation. Electronically Signed   By: Fidela Salisbury MD   On: 01/05/2021 02:10    ASSESSMENT: This is  a 75 years old African-American female with highly suspicious stage IIIb/IV (T2 a, N2, M0/M1a) lung cancer pending tissue diagnosis and full staging work-up.  She presented with large left lower lobe lung mass in addition to suspicious left hilar and subcarinal lymphadenopathy and bilateral pulmonary nodules.   PLAN: I had a lengthy discussion with the patient today about her current condition and further investigation to confirm her diagnosis and possible treatment options. I personally and independently reviewed the scan images and discussed the result and showed the images to the patient today. I recommended for the patient to complete the staging work-up by ordering a PET scan as well as MRI of the brain to rule out any other metastatic disease. I will also refer the patient to pulmonary medicine for consideration of bronchoscopy with endobronchial ultrasound and biopsy for tissue diagnosis. I will arrange for the patient to come back for follow-up visit in around 2 weeks  for more detailed discussion for treatment options. The patient was seen by Dr. Lisbeth Renshaw later today for discussion of the potential radiotherapy option. She was advised to call immediately if she has any other concerning symptoms in the interval.  The patient voices understanding of current disease status and treatment options and is in agreement with the current care plan.  All questions were answered. The patient knows to call the clinic with any problems, questions or concerns. We can certainly see the patient much sooner if necessary.  Thank you so much for allowing me to participate in the care of Hoyle Barr. I will continue to follow up the patient with you and assist in her care.  The total time spent in the appointment was 60 minutes.  Disclaimer: This note was dictated with voice recognition software. Similar sounding words can inadvertently be transcribed and may not be corrected upon review.   Eilleen Kempf January 12, 2021, 1:41 PM

## 2021-01-12 NOTE — Progress Notes (Signed)
Radiation Oncology         [phone] ________________________________  Name: Stacey Harmon        MRN: [id number]  Date of Service: 01/12/2021 DOB: Oct 09, 1945  VQ:[id number], [name], MD  Dina Rich, Barbette Hair, MD     REFERRING PHYSICIAN: Merryl Hacker, MD   DIAGNOSIS: The encounter diagnosis was Malignant neoplasm of lower lobe of left lung ([place]).   HISTORY OF PRESENT ILLNESS: Stacey Harmon is a 75 y.o. female seen at the request of Dr. Julien Nordmann for probable left lung cancer.  The patient was experiencing symptoms of left chest wall pain following a fall, she presented to the emergency room on 01/05/2021, imaging of the chest showed a mass in the left lower lobe no acute rib fracture was identified, CT imaging of the chest with contrast showed a 3.8 cm left lower lobe mass consistent with bronchogenic carcinoma left hilar and subcarinal adenopathy was identified and a mixed density nodule was seen in both upper lobes.  Evidence of emphysema and atherosclerotic disease was also seen.  Imaging of her right and left knees showed no acute findings but degenerative changes consistent with arthritis.  She is seen today to discuss neck steps in working up presumed non-small cell lung cancer.    PREVIOUS RADIATION THERAPY: No   PAST MEDICAL HISTORY:  Past Medical History:  Diagnosis Date   Hypertension        PAST SURGICAL HISTORY:No past surgical history on file.   FAMILY HISTORY: No family history on file.   SOCIAL HISTORY:  reports that she has quit smoking. She has quit using smokeless tobacco. She reports that she does not drink alcohol and does not use drugs.  The patient is divorced and lives in [place]. She helps care for her elderly father.   ALLERGIES: Penicillins   MEDICATIONS:  Current Outpatient Medications  Medication Sig Dispense Refill   ALPRAZolam (XANAX XR) 0.5 MG 24 hr tablet Take 0.5 mg by mouth at bedtime.       ALPRAZolam (XANAX) 0.5 MG tablet TK 1 T PO  TID      amLODipine (NORVASC) 10 MG tablet Take 10 mg by mouth daily.       azaTHIOprine (IMURAN) 50 MG tablet Take 125 mg by mouth daily.      benzonatate (TESSALON) 200 MG capsule Take 1 capsule (200 mg total) by mouth 2 (two) times daily as needed for cough. 20 capsule 0   brimonidine-timolol (COMBIGAN) 0.2-0.5 % ophthalmic solution Place 1 drop into both eyes every 12 (twelve) hours.     clindamycin (CLEOCIN) 300 MG capsule Take 300 mg by mouth every 8 (eight) hours.     flavoxATE (URISPAS) 100 MG tablet Take 100 mg by mouth 3 (three) times daily.     latanoprost (XALATAN) 0.005 % ophthalmic solution Place 1 drop into both eyes at bedtime.       Multiple Vitamins-Calcium (ONE-A-DAY WOMENS PO) Take 1 tablet by mouth daily.       naproxen (NAPROSYN) 500 MG tablet Take 1 tablet (500 mg total) by mouth 2 (two) times daily. 30 tablet 0   NYSTATIN powder Apply topically 3 (three) times daily.     oxyCODONE-acetaminophen (PERCOCET/ROXICET) 5-325 MG tablet Take 1 tablet by mouth every 6 (six) hours as needed for severe pain. 6 tablet 0   predniSONE (DELTASONE) 10 MG tablet Take 10 mg by mouth daily.       triamterene-hydrochlorothiazide (MAXZIDE-25) 37.5-25 MG per tablet Take 2 tablets  by mouth daily.       No current facility-administered medications for this encounter.     REVIEW OF SYSTEMS: On review of systems, the patient reports that she is having occasional cough with white mucous but denies fevers, hemoptysis or unintended weight changes. She describes feeling cold often, but no other complaints are verbalized.     PHYSICAL EXAM:  Wt Readings from Last 3 Encounters:  01/05/21 138 lb (62.6 kg)  01/10/18 138 lb (62.6 kg)   Temp Readings from Last 3 Encounters:  01/05/21 98.1 F (36.7 C) (Oral)  10/29/18 (!) 97.3 F (36.3 C)  10/08/18 97.9 F (36.6 C)   BP Readings from Last 3 Encounters:  01/05/21 114/71  08/01/18 121/73  01/10/18 121/79   Pulse Readings from Last 3 Encounters:   01/05/21 78  01/10/18 64  01/04/18 60   In general this is a well appearing African-American female in no acute distress.  She's alert and oriented x4 and appropriate throughout the examination. Cardiopulmonary assessment is negative for acute distress and she exhibits normal effort.     ECOG = 1  0 - Asymptomatic (Fully active, able to carry on all predisease activities without restriction)  1 - Symptomatic but completely ambulatory (Restricted in physically strenuous activity but ambulatory and able to carry out work of a light or sedentary nature. For example, light housework, office work)  2 - Symptomatic, <50% in bed during the day (Ambulatory and capable of all self care but unable to carry out any work activities. Up and about more than 50% of waking hours)  3 - Symptomatic, >50% in bed, but not bedbound (Capable of only limited self-care, confined to bed or chair 50% or more of waking hours)  4 - Bedbound (Completely disabled. Cannot carry on any self-care. Totally confined to bed or chair)  5 - Death   Eustace Pen MM, Creech RH, Tormey DC, et al. [phone]). "Toxicity and response criteria of the [facility]". [place] Oncol. 5 (6): 649-55    LABORATORY DATA:  Lab Results  Component Value Date   WBC 9.3 01/05/2021   HGB 11.7 (L) 01/05/2021   HCT 35.2 (L) 01/05/2021   MCV 101.4 (H) 01/05/2021   PLT 260 01/05/2021   Lab Results  Component Value Date   NA 140 01/05/2021   K 3.9 01/05/2021   CL 106 01/05/2021   CO2 24 01/05/2021   No results found for: ALT, AST, GGT, ALKPHOS, BILITOT    RADIOGRAPHY: DG Ribs Unilateral W/Chest Left  Result Date: 01/05/2021 CLINICAL DATA:  Left rib pain, fall EXAM: LEFT RIBS AND CHEST - 3+ VIEW COMPARISON:  01/10/2018 FINDINGS: The lungs are symmetrically well expanded. Since prior examination, there has developed a 4.3 x 3.4 cm mass within the left lower lobe. Subcentimeter nodular density is seen within the left  upper lobe, nonspecific. The lungs are otherwise clear. No pneumothorax or pleural effusion. Cardiac size within normal limits. Pulmonary vascularity is normal. No acute bone abnormality. Specifically, no acute rib fracture identified. IMPRESSION: No acute rib fracture. Interval development of a 4.3 cm left lower lobe pulmonary mass. Dedicated nonemergent contrast enhanced CT imaging of the chest is recommended for further evaluation. Left apical nodular density can be simultaneously assessed. Electronically Signed   By: Fidela Salisbury MD   On: 01/05/2021 02:14   CT Chest W Contrast  Result Date: 01/05/2021 CLINICAL DATA:  Abnormal chest x-ray.  Presentation for fall EXAM: CT CHEST WITH CONTRAST TECHNIQUE:  Multidetector CT imaging of the chest was performed during intravenous contrast administration. CONTRAST:  42mL OMNIPAQUE IOHEXOL 350 MG/ML SOLN COMPARISON:  Radiography from earlier the same day FINDINGS: Cardiovascular: Normal heart size. No pericardial effusion. Aortic and coronary atherosclerosis. No acute vascular finding Mediastinum/Nodes: Heterogeneous enlargement of a left hilar node measuring 2 cm. Nonenlarged but centrally low-density subcarinal node measuring 15 mm. 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 Lungs/Pleura: Confirmed mass in the left lower lobe measuring up to 3.7 cm with central low-density appearance similar to the enlarged nodes. Solid and subsolid 14 mm subpleural nodule in the left upper lobe. Solid and subsolid 12 mm nodule with adjacent solid 5 mm nodule in the right upper lobe along the fissure. Tiny apical pulmonary nodule is noted in the right lung. Airway thickening with some right lower lobe mucoid impaction and atelectasis. Centrilobular emphysema Upper Abdomen: No evidence of mass or acute disease. Musculoskeletal: No noted metastatic disease or fracture. IMPRESSION: 1. 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 There is  left hilar and subcarinal adenopathy. Recommend referral to multi disciplinary thoracic oncology service. 2. Suspicious mixed density nodules in the bilateral upper lobes. 3. Aortic Atherosclerosis (ICD10-I70.0) and Emphysema (ICD10-J43.9). Electronically Signed   By: Monte Fantasia M.D.   On: 01/05/2021 05:11   DG Knee Complete 4 Views Left  Result Date: 01/05/2021 CLINICAL DATA:  Bilateral knee pain EXAM: LEFT KNEE - COMPLETE 4+ VIEW COMPARISON:  None. FINDINGS: Normal alignment. No fracture or dislocation. Mild medial compartment joint space narrowing in keeping with minimal degenerative arthritis in this location. No effusion. Soft tissues are unremarkable. IMPRESSION: Minimal medial compartment degenerative arthritis. Electronically Signed   By: Fidela Salisbury MD   On: 01/05/2021 02:10   DG Knee Complete 4 Views Right  Result Date: 01/05/2021 CLINICAL DATA:  Bilateral knee pain EXAM: RIGHT KNEE - COMPLETE 4+ VIEW COMPARISON:  None. FINDINGS: Normal alignment. No fracture or dislocation. Mild degenerative chondrocalcinosis of the medial and lateral menisci. No effusion. Soft tissues are unremarkable. IMPRESSION: Mild degenerative chondrocalcinosis. No acute fracture or dislocation. Electronically Signed   By: Fidela Salisbury MD   On: 01/05/2021 02:10       IMPRESSION/PLAN: 1. Probable advanced stage non-small cell lung cancer originating in the left lower lobe. Dr. Lisbeth Renshaw discusses the imaging findings and work-up thus far.  We discussed her case this morning in multidisciplinary thoracic oncology conference and recommendations are for continued work-up to include an MRI of the brain PET scan and biopsy.  Pulmonary believes that she would be a good candidate for bronchoscopic approach to obtain tissue for confirmation.  Dr. Lisbeth Renshaw discussed with scenarios for which radiotherapy is utilized to treat lung cancer in either definitive terms of chemoradiation versus palliative treatment if metastatic disease  is identified.  We discussed the risks, benefits, short, and long term effects of radiotherapy, as well as the different intents. Dr. Lisbeth Renshaw discusses the delivery and logistics of radiotherapy and anticipates a course of between 2 or up to 6-1/2 weeks of radiotherapy.  We will contact her once additional imaging and biopsy confirm her disease status to see how she would like to proceed. At this time however she is not leaning toward treatment if her disease is advanced.  In a visit lasting 60 minutes, greater than 50% of the time was spent face to face discussing the patient's condition, in preparation for the discussion, and coordinating the patient's care.   The above documentation reflects my direct  findings during this shared patient visit. Please see the separate note by Dr. Lisbeth Renshaw on this date for the remainder of the patient's plan of care.    Carola Rhine, [facility]   **Disclaimer: This note was dictated with voice recognition software. Similar sounding words can inadvertently be transcribed and this note may contain transcription errors which may not have been corrected upon publication of note.**

## 2021-01-13 ENCOUNTER — Telehealth: Payer: Self-pay | Admitting: Internal Medicine

## 2021-01-13 ENCOUNTER — Encounter: Payer: Self-pay | Admitting: *Deleted

## 2021-01-13 NOTE — Progress Notes (Signed)
I followed up on patients appts and schedule.  I reached out to auth coordination team to help with insurance auth for scans. I also reached out to Dr. Valeta Harms, pulmonologist to update him on referral.

## 2021-01-13 NOTE — Telephone Encounter (Signed)
Scheduled appt per 8/11 los. Called pt, no answer. Left msg with appt date and time.

## 2021-01-14 ENCOUNTER — Other Ambulatory Visit: Payer: Self-pay

## 2021-01-16 ENCOUNTER — Telehealth: Payer: Self-pay | Admitting: *Deleted

## 2021-01-16 NOTE — Telephone Encounter (Signed)
I followed up on Stacey Harmon's appts.  She was not scheduled for pet and mri brain. I called radiology to scheduled and was given a time and date. This date was after her scheduled appt with Dr. Julien Nordmann so her appt with him is changed to be after per his note.  I called Stacey Harmon to update but was unable to reach. I did leave vm  message with my name and phone number to call.

## 2021-01-18 ENCOUNTER — Telehealth: Payer: Self-pay | Admitting: *Deleted

## 2021-01-18 ENCOUNTER — Institutional Professional Consult (permissible substitution): Payer: Medicare Other | Admitting: Emergency Medicine

## 2021-01-18 NOTE — Telephone Encounter (Signed)
I called Stacey Harmon to update on her scans and follow up appt with Stacey Harmon. I did explain to her nothing to eat or drink 6 hours prior to PET scan.  She is unable to make her appt with Stacey Harmon today and will call his office to re-schedule.

## 2021-01-20 ENCOUNTER — Telehealth: Payer: Self-pay | Admitting: *Deleted

## 2021-01-20 NOTE — Telephone Encounter (Signed)
I followed up on Stacey Harmon's schedule. She is set up for her plan of care. I called her to make sure she understood her appts.  I reviewed with her and she verbalized understanding of appt time and place.

## 2021-01-26 ENCOUNTER — Ambulatory Visit: Payer: Medicare Other | Admitting: Emergency Medicine

## 2021-01-26 ENCOUNTER — Other Ambulatory Visit: Payer: Self-pay

## 2021-01-26 ENCOUNTER — Encounter: Payer: Self-pay | Admitting: Emergency Medicine

## 2021-01-26 ENCOUNTER — Ambulatory Visit: Payer: Medicare Other | Admitting: Internal Medicine

## 2021-01-26 DIAGNOSIS — C3432 Malignant neoplasm of lower lobe, left bronchus or lung: Secondary | ICD-10-CM | POA: Diagnosis not present

## 2021-01-26 NOTE — Progress Notes (Signed)
Subjective:    Patient ID: Stacey Harmon, female    DOB: [birth year], 75 y.o.   MRN: [id number]  HPI 75 year old former smoker (60 pack years) with a history of hypertension, cirrhosis (imuran, prednisone).  She suffered a fall on 01/05/2021 and went to the emergency department with rib cage pain.  Imaging revealed a left lower lobe mass on chest x-ray and then CT scan as below.  She is referred now for further evaluation abnormal CT. She is feeling fairly well currently, is having some cough. Prod of thin white mucous.   CT scan of the chest performed 01/05/2021 reviewed by me shows a 3.8 cm left lower lobe mass consistent with malignancy with associated left hilar and subcarinal lymphadenopathy.   Review of Systems As per HPI   Past Medical History:  Diagnosis Date   Hypertension    Cirrhosis   No family history on file.  CVA mother Rectal cancer Father Stomach cancer Brother   Social History   Socioeconomic History   Marital status: Divorced    Spouse name: Not on file   Number of children: Not on file   Years of education: Not on file   Highest education level: Not on file  Occupational History   Not on file  Tobacco Use   Smoking status: Former    Packs/day: 0.50    Years: 35.00    Pack years: 17.50    Types: Cigarettes   Smokeless tobacco: Former  Substance and Sexual Activity   Alcohol use: Never   Drug use: Never   Sexual activity: Not on file  Other Topics Concern   Not on file  Social History Narrative   Not on file   Social Determinants of Health   Financial Resource Strain: Not on file  Food Insecurity: Not on file  Transportation Needs: Not on file  Physical Activity: Not on file  Stress: Not on file  Social Connections: Not on file  Intimate Partner Violence: Not on file    No inhaled exposures  Allergies  Allergen Reactions   Penicillins Swelling     Outpatient Medications Prior to Visit  Medication Sig Dispense Refill   ALPRAZolam  (XANAX XR) 0.5 MG 24 hr tablet Take 0.5 mg by mouth at bedtime.       ALPRAZolam (XANAX) 0.5 MG tablet      amLODipine (NORVASC) 10 MG tablet Take 10 mg by mouth daily.       azaTHIOprine (IMURAN) 50 MG tablet Take 125 mg by mouth daily.      brimonidine-timolol (COMBIGAN) 0.2-0.5 % ophthalmic solution Place 1 drop into both eyes every 12 (twelve) hours.     latanoprost (XALATAN) 0.005 % ophthalmic solution Place 1 drop into both eyes at bedtime.       Multiple Vitamins-Calcium (ONE-A-DAY WOMENS PO) Take 1 tablet by mouth daily.       NYSTATIN powder Apply topically 3 (three) times daily.     Omega-3 Fatty Acids (FISH OIL) 1000 MG CAPS Take by mouth.     Polyvinyl Alcohol (LUBRICANT DROPS OP) Apply to eye.     predniSONE (DELTASONE) 10 MG tablet Take 10 mg by mouth daily.       triamterene-hydrochlorothiazide (MAXZIDE-25) 37.5-25 MG per tablet Take 2 tablets by mouth daily.       No facility-administered medications prior to visit.         Objective:   Physical Exam Vitals:   01/26/21 1401  BP: 116/68  Pulse: 80  Temp: 98.1 F (36.7 C)  TempSrc: Oral  SpO2: 97%  Weight: 139 lb 6.4 oz (63.2 kg)  Height: 5\' 2"  (1.575 m)   Gen: Pleasant, well-nourished, in no distress,  normal affect  ENT: No lesions,  mouth clear,  oropharynx clear, no postnasal drip  Neck: No JVD, no stridor  Lungs: No use of accessory muscles, soft left-sided rhonchi, no wheeze or crackles  Cardiovascular: RRR, heart sounds normal, no murmur or gallops, no peripheral edema  Musculoskeletal: No deformities, no cyanosis or clubbing  Neuro: alert, awake, non focal  Skin: Warm, no lesions or rash      Assessment & Plan:  Malignant neoplasm of lower lobe of left lung ([place]) She needs bronchoscopy with EBUS for tissue diagnosis in order to initiate therapy.  That said she is hesitant to commit to this right now.  She is concerned that there may not be any available treatment that she would be willing to  take -depending on her disease burden she is not sure that having treatment will offer much benefit, and she is concerned about side effects.  I tried to explain to her that there may be significant benefit to be had.  She wants to hold off on scheduling bronchoscopy, get her staging imaging and then talk to Dr. Julien Nordmann again about the possible treatments, benefits, side effects.  I will defer the procedure for now but get back with her soon after she goes back to oncology to determine whether she is willing to have the bronchoscopy.   Baltazar Apo, MD, PhD 01/26/2021, 2:42 PM [facility] [phone] or if no answer before 7:00PM call [phone] For any issues after 7:00PM please call eLink [phone]

## 2021-01-26 NOTE — Assessment & Plan Note (Signed)
She needs bronchoscopy with EBUS for tissue diagnosis in order to initiate therapy.  That said she is hesitant to commit to this right now.  She is concerned that there may not be any available treatment that she would be willing to take -depending on her disease burden she is not sure that having treatment will offer much benefit, and she is concerned about side effects.  I tried to explain to her that there may be significant benefit to be had.  She wants to hold off on scheduling bronchoscopy, get her staging imaging and then talk to Dr. Julien Nordmann again about the possible treatments, benefits, side effects.  I will defer the procedure for now but get back with her soon after she goes back to oncology to determine whether she is willing to have the bronchoscopy.

## 2021-01-26 NOTE — Patient Instructions (Signed)
Get your PET scan and MRI scan as planned Follow Dr. Julien Nordmann on 8/30 to discuss your scans and the pros and cons of treatment for lung cancer. Follow Dr. Lamonte Sakai next available after that oncology visit so we can decide whether you want to proceed with bronchoscopy and biopsies.

## 2021-01-27 ENCOUNTER — Ambulatory Visit (HOSPITAL_COMMUNITY)
Admission: RE | Admit: 2021-01-27 | Discharge: 2021-01-27 | Disposition: A | Discharge status: Home / Self Care | Payer: Medicare Other | Source: Ambulatory Visit | Attending: Internal Medicine | Admitting: Internal Medicine

## 2021-01-27 ENCOUNTER — Encounter (HOSPITAL_COMMUNITY)
Admission: RE | Admit: 2021-01-27 | Discharge: 2021-01-27 | Disposition: A | Discharge status: Home / Self Care | Payer: Medicare Other | Source: Ambulatory Visit | Attending: Internal Medicine | Admitting: Internal Medicine

## 2021-01-27 DIAGNOSIS — Z131 Encounter for screening for diabetes mellitus: Secondary | ICD-10-CM | POA: Diagnosis not present

## 2021-01-27 DIAGNOSIS — C779 Secondary and unspecified malignant neoplasm of lymph node, unspecified: Secondary | ICD-10-CM | POA: Insufficient documentation

## 2021-01-27 DIAGNOSIS — C3432 Malignant neoplasm of lower lobe, left bronchus or lung: Secondary | ICD-10-CM

## 2021-01-27 DIAGNOSIS — C3490 Malignant neoplasm of unspecified part of unspecified bronchus or lung: Secondary | ICD-10-CM | POA: Diagnosis present

## 2021-01-27 LAB — GLUCOSE, CAPILLARY: Glucose-Capillary: 98 mg/dL (ref 70–99)

## 2021-01-27 MED ORDER — GADOBUTROL 1 MMOL/ML IV SOLN
6.0000 mL | Freq: Once | INTRAVENOUS | Status: AC | PRN
  Administered 2021-01-27: 6 mL via INTRAVENOUS

## 2021-01-27 MED ORDER — FLUDEOXYGLUCOSE F - 18 (FDG) INJECTION: 6.9500 | Freq: Once | INTRAVENOUS | Status: DC

## 2021-01-30 ENCOUNTER — Encounter: Payer: Self-pay | Admitting: *Deleted

## 2021-01-30 ENCOUNTER — Other Ambulatory Visit: Payer: Self-pay | Admitting: Medical Oncology

## 2021-01-30 DIAGNOSIS — C3432 Malignant neoplasm of lower lobe, left bronchus or lung: Secondary | ICD-10-CM

## 2021-01-30 NOTE — Progress Notes (Signed)
I received a message from Dr. Lamonte Sakai that patient did not want to have bronchoscopy at this time. Patient did get her PET and MRI brain scan. She does have a follow up with Dr. Julien Nordmann to go over updated information.

## 2021-01-31 ENCOUNTER — Inpatient Hospital Stay: Payer: Medicare Other | Admitting: Internal Medicine

## 2021-01-31 ENCOUNTER — Inpatient Hospital Stay: Payer: Medicare Other

## 2021-01-31 ENCOUNTER — Other Ambulatory Visit: Payer: Self-pay

## 2021-01-31 VITALS — BP 119/72 | HR 82 | Temp 98.4°F | Resp 18 | Ht 62.0 in | Wt 138.9 lb

## 2021-01-31 DIAGNOSIS — R918 Other nonspecific abnormal finding of lung field: Secondary | ICD-10-CM | POA: Diagnosis not present

## 2021-01-31 DIAGNOSIS — C3432 Malignant neoplasm of lower lobe, left bronchus or lung: Secondary | ICD-10-CM | POA: Diagnosis not present

## 2021-01-31 LAB — CBC WITH DIFFERENTIAL (CANCER CENTER ONLY)
Abs Immature Granulocytes: 0.02 10*3/uL (ref 0.00–0.07)
Basophils Absolute: 0.1 10*3/uL (ref 0.0–0.1)
Basophils Relative: 1 %
Eosinophils Absolute: 0 10*3/uL (ref 0.0–0.5)
Eosinophils Relative: 0 %
HCT: 39.3 % (ref 36.0–46.0)
Hemoglobin: 13.3 g/dL (ref 12.0–15.0)
Immature Granulocytes: 0 %
Lymphocytes Relative: 9 %
Lymphs Abs: 0.8 10*3/uL (ref 0.7–4.0)
MCH: 33.4 pg (ref 26.0–34.0)
MCHC: 33.8 g/dL (ref 30.0–36.0)
MCV: 98.7 fL (ref 80.0–100.0)
Monocytes Absolute: 0.4 10*3/uL (ref 0.1–1.0)
Monocytes Relative: 4 %
Neutro Abs: 7.2 10*3/uL (ref 1.7–7.7)
Neutrophils Relative %: 86 %
Platelet Count: 235 10*3/uL (ref 150–400)
RBC: 3.98 MIL/uL (ref 3.87–5.11)
RDW: 14.6 % (ref 11.5–15.5)
WBC Count: 8.5 10*3/uL (ref 4.0–10.5)
nRBC: 0 % (ref 0.0–0.2)

## 2021-01-31 LAB — CMP (CANCER CENTER ONLY)
ALT: 25 U/L (ref 0–44)
AST: 27 U/L (ref 15–41)
Albumin: 3.6 g/dL (ref 3.5–5.0)
Alkaline Phosphatase: 55 U/L (ref 38–126)
Anion gap: 9 (ref 5–15)
BUN: 18 mg/dL (ref 8–23)
CO2: 26 mmol/L (ref 22–32)
Calcium: 10 mg/dL (ref 8.9–10.3)
Chloride: 105 mmol/L (ref 98–111)
Creatinine: 0.86 mg/dL (ref 0.44–1.00)
GFR, Estimated: >60 mL/min (ref >60)
Glucose, Bld: 105 mg/dL — ABNORMAL HIGH (ref 70–99)
Potassium: 4.4 mmol/L (ref 3.5–5.1)
Sodium: 140 mmol/L (ref 135–145)
Total Bilirubin: 1.5 mg/dL — ABNORMAL HIGH (ref 0.3–1.2)
Total Protein: 7.1 g/dL (ref 6.5–8.1)

## 2021-01-31 NOTE — Progress Notes (Signed)
Stacey Harmon Telephone:(336) [phone]   Fax:(336) [place], [place] [address]  DIAGNOSIS: Highly suspicious stage IIIB (T3, N2, M0) lung cancer pending tissue diagnosis.  She presented with large left lower lobe lung mass in addition to suspicious left hilar and subcarinal lymphadenopathy and left upper lobe pulmonary nodule.  PRIOR THERAPY: None  CURRENT THERAPY: None  INTERVAL HISTORY: Stacey Harmon 75 y.o. female returns to the clinic today for follow-up visit.  The patient is feeling fine today with no concerning complaints except for mild cough.  She has no chest pain, shortness of breath or hemoptysis.  She denied having any fever or chills.  She has no nausea, vomiting, diarrhea or constipation.  She denied having any headache or visual changes.  She has no weight loss or night sweats.  She had several studies performed recently including a PET scan as well as MRI of the brain for restaging of her disease.  The patient was also referred to Dr. Lamonte Sakai for consideration of bronchoscopy and endobronchial ultrasound for tissue diagnosis but she declined the procedure and wanted to have a discussion with me first regarding potential treatment options  MEDICAL HISTORY: Past Medical History:  Diagnosis Date   Hypertension     ALLERGIES:  is allergic to penicillins.  MEDICATIONS:  Current Outpatient Medications  Medication Sig Dispense Refill   ALPRAZolam (XANAX XR) 0.5 MG 24 hr tablet Take 0.5 mg by mouth at bedtime.       ALPRAZolam (XANAX) 0.5 MG tablet      amLODipine (NORVASC) 10 MG tablet Take 10 mg by mouth daily.       azaTHIOprine (IMURAN) 50 MG tablet Take 125 mg by mouth daily.      brimonidine-timolol (COMBIGAN) 0.2-0.5 % ophthalmic solution Place 1 drop into both eyes every 12 (twelve) hours.     latanoprost (XALATAN) 0.005 % ophthalmic solution Place 1 drop into both eyes at bedtime.        Multiple Vitamins-Calcium (ONE-A-DAY WOMENS PO) Take 1 tablet by mouth daily.       NYSTATIN powder Apply topically 3 (three) times daily.     Omega-3 Fatty Acids (FISH OIL) 1000 MG CAPS Take by mouth.     Polyvinyl Alcohol (LUBRICANT DROPS OP) Apply to eye.     predniSONE (DELTASONE) 10 MG tablet Take 10 mg by mouth daily.       triamterene-hydrochlorothiazide (MAXZIDE-25) 37.5-25 MG per tablet Take 2 tablets by mouth daily.       No current facility-administered medications for this visit.   Facility-Administered Medications Ordered in Other Visits  Medication Dose Route Frequency Provider Last Rate Last Admin   fludeoxyglucose F - 18 (FDG) injection 3.38 millicurie  2.50 millicurie Intravenous Once Abigail Miyamoto, MD        SURGICAL HISTORY: No past surgical history on file.  REVIEW OF SYSTEMS:  Constitutional: positive for fatigue Eyes: negative Ears, nose, mouth, throat, and face: negative Respiratory: positive for cough Cardiovascular: negative Gastrointestinal: negative Genitourinary:negative Integument/breast: negative Hematologic/lymphatic: negative Musculoskeletal:negative Neurological: negative Behavioral/Psych: negative Endocrine: negative Allergic/Immunologic: negative   PHYSICAL EXAMINATION: General appearance: alert, cooperative, fatigued, and no distress Head: Normocephalic, without obvious abnormality, atraumatic Neck: no adenopathy, no JVD, supple, symmetrical, trachea midline, and thyroid not enlarged, symmetric, no tenderness/mass/nodules Lymph nodes: Cervical, supraclavicular, and axillary nodes normal. Resp: clear to auscultation bilaterally Back: symmetric, no curvature. ROM normal. No CVA tenderness. Cardio:  regular rate and rhythm, S1, S2 normal, no murmur, click, rub or gallop GI: soft, non-tender; bowel sounds normal; no masses,  no organomegaly Extremities: extremities normal, atraumatic, no cyanosis or edema Neurologic: Alert and oriented X 3, normal  strength and tone. Normal symmetric reflexes. Normal coordination and gait  ECOG PERFORMANCE STATUS: 1 - Symptomatic but completely ambulatory  Blood pressure 119/72, pulse 82, temperature 98.4 F (36.9 C), temperature source Tympanic, resp. rate 18, height 5\' 2"  (1.575 m), weight 138 lb 14.4 oz (63 kg), SpO2 100 %.  LABORATORY DATA: Lab Results  Component Value Date   WBC 7.0 01/12/2021   HGB 12.0 01/12/2021   HCT 36.8 01/12/2021   MCV 100.3 (H) 01/12/2021   PLT 262 01/12/2021      Chemistry      Component Value Date/Time   NA 140 01/12/2021 1255   K 4.6 01/12/2021 1255   CL 108 01/12/2021 1255   CO2 26 01/12/2021 1255   BUN 17 01/12/2021 1255   CREATININE 0.77 01/12/2021 1255      Component Value Date/Time   CALCIUM 9.3 01/12/2021 1255   ALKPHOS 42 01/12/2021 1255   AST 32 01/12/2021 1255   ALT 29 01/12/2021 1255   BILITOT 1.1 01/12/2021 1255       RADIOGRAPHIC STUDIES: DG Ribs Unilateral W/Chest Left  Result Date: 01/05/2021 CLINICAL DATA:  Left rib pain, fall EXAM: LEFT RIBS AND CHEST - 3+ VIEW COMPARISON:  01/10/2018 FINDINGS: The lungs are symmetrically well expanded. Since prior examination, there has developed a 4.3 x 3.4 cm mass within the left lower lobe. Subcentimeter nodular density is seen within the left upper lobe, nonspecific. The lungs are otherwise clear. No pneumothorax or pleural effusion. Cardiac size within normal limits. Pulmonary vascularity is normal. No acute bone abnormality. Specifically, no acute rib fracture identified. IMPRESSION: No acute rib fracture. Interval development of a 4.3 cm left lower lobe pulmonary mass. Dedicated nonemergent contrast enhanced CT imaging of the chest is recommended for further evaluation. Left apical nodular density can be simultaneously assessed. Electronically Signed   By: Fidela Salisbury MD   On: 01/05/2021 02:14   CT Chest W Contrast  Result Date: 01/05/2021 CLINICAL DATA:  Abnormal chest x-ray.  Presentation  for fall EXAM: CT CHEST WITH CONTRAST TECHNIQUE: Multidetector CT imaging of the chest was performed during intravenous contrast administration. CONTRAST:  75mL OMNIPAQUE IOHEXOL 350 MG/ML SOLN COMPARISON:  Radiography from earlier the same day FINDINGS: Cardiovascular: Normal heart size. No pericardial effusion. Aortic and coronary atherosclerosis. No acute vascular finding Mediastinum/Nodes: Heterogeneous enlargement of a left hilar node measuring 2 cm. Nonenlarged but centrally low-density subcarinal node measuring 15 mm. 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 Lungs/Pleura: Confirmed mass in the left lower lobe measuring up to 3.7 cm with central low-density appearance similar to the enlarged nodes. Solid and subsolid 14 mm subpleural nodule in the left upper lobe. Solid and subsolid 12 mm nodule with adjacent solid 5 mm nodule in the right upper lobe along the fissure. Tiny apical pulmonary nodule is noted in the right lung. Airway thickening with some right lower lobe mucoid impaction and atelectasis. Centrilobular emphysema Upper Abdomen: No evidence of mass or acute disease. Musculoskeletal: No noted metastatic disease or fracture. IMPRESSION: 1. 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 There is left hilar and subcarinal adenopathy. Recommend referral to multi disciplinary thoracic oncology service. 2. Suspicious mixed density nodules in the bilateral upper lobes. 3. Aortic Atherosclerosis (ICD10-I70.0) and  Emphysema (ICD10-J43.9). Electronically Signed   By: Monte Fantasia M.D.   On: 01/05/2021 05:11   MR BRAIN W WO CONTRAST  Result Date: 01/28/2021 CLINICAL DATA:  Staging of non-small cell lung carcinoma EXAM: MRI HEAD WITHOUT AND WITH CONTRAST TECHNIQUE: Multiplanar, multiecho pulse sequences of the brain and surrounding structures were obtained without and with intravenous contrast. CONTRAST:  66mL GADAVIST GADOBUTROL 1 MMOL/ML IV SOLN COMPARISON:   None. FINDINGS: Brain: No acute infarct, mass effect or extra-axial collection. No acute or chronic hemorrhage. There is multifocal hyperintense T2-weighted signal within the white matter. Generalized volume loss without a clear lobar predilection. The midline structures are normal. 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 No contrast-enhancing lesions. Vascular: Major flow voids are preserved. Skull and upper cervical spine: Normal calvarium and skull base. Visualized upper cervical spine and soft tissues are normal. Sinuses/Orbits:No paranasal sinus fluid levels or advanced mucosal thickening. No mastoid or middle ear effusion. Normal orbits. IMPRESSION: 1. No intracranial metastatic disease. 2. Chronic small vessel ischemic changes and volume loss. Electronically Signed   By: Ulyses Jarred M.D.   On: 01/28/2021 16:20   NM PET Image Initial (PI) Skull Base To Thigh  Result Date: 01/30/2021 CLINICAL DATA:  Initial treatment strategy for left lower lobe mass. EXAM: NUCLEAR MEDICINE PET SKULL BASE TO THIGH TECHNIQUE: 7.0 mCi F-18 FDG was injected intravenously. Full-ring PET imaging was performed from the skull base to thigh after the radiotracer. CT data was obtained and used for attenuation correction and anatomic localization. Fasting blood glucose: 98 mg/dl COMPARISON:  Chest CT 01/05/2021 FINDINGS: Mediastinal blood pool activity: SUV max 2.2 Liver activity: SUV max NA NECK: No hypermetabolic lymph nodes in the neck. Incidental CT findings: none CHEST: Dominant left lower lobe pulmonary mass seen on recent diagnostic chest CT is markedly hypermetabolic with SUV max = 61.9. Hypermetabolic left hilar metastatic lymphadenopathy demonstrates SUV max = 10.4 and subcarinal metastases are hypermetabolic with SUV max = 7.1. 12 mm irregular nodular opacity in the left upper lobe (50/9) is hypermetabolic with SUV max = 32.6. 12 mm irregular nodular opacity seen in the posterior right upper lobe along  the fissure previously has nearly resolved and shows no hypermetabolism on PET imaging today. Incidental CT findings: Centrilobular emphsyema noted. No pleural effusion. Coronary artery calcification is evident. Mild atherosclerotic calcification is noted in the wall of the thoracic aorta. ABDOMEN/PELVIS: No abnormal hypermetabolic activity within the liver, pancreas, adrenal glands, or spleen. No hypermetabolic lymph nodes in the abdomen or pelvis. Incidental CT findings: Layering tiny calcified gallstones. There is moderate atherosclerotic calcification of the abdominal aorta without aneurysm. Left colonic diverticulosis without diverticulitis 17 mm low-density lesion noted uncinate process of the pancreas, present since a CT scan from 2008 and only mildly progressive over that interval. No hypermetabolism on PET imaging today. SKELETON: Hypermetabolism is identified in the anterior right fifth rib at the site of a healing fracture. There is low-level FDG uptake and sclerosis in the adjacent anterior right sixth rib. No other hypermetabolic bone disease evident. Incidental CT findings: none IMPRESSION: 1. The dominant left lower lobe pulmonary mass seen on recent diagnostic CT chest is markedly hypermetabolic consistent with primary bronchogenic neoplasm. There is hypermetabolic metastatic lymphadenopathy in the left hilum and subcarinal station. 2. 12 mm irregular left upper lobe pulmonary nodule is markedly hypermetabolic. This could represent metastatic disease or synchronous primary. 3. 12 mm posterior right upper lobe nodule seen on previous CT scan is not clearly visualized on  today's study and there was no hypermetabolism in this region. Features likely reflect resolving infectious/inflammatory process but attention on follow-up recommended. 4. 7 mm hypoattenuating lesion identified in the uncinate process of the pancreas. No hypermetabolism. This lesion was visible on a remote abdomen/pelvis CT from 2008  and has increased only mildly in size over that time, consistent with benign etiology. 5. Hypermetabolism identified in the anterior right fifth rib at the site of rib fracture. There is low-level FDG uptake in the adjacent anterior right sixth rib. Given adjacent rib involvement, trauma is considered the most likely etiology. 6. Cholelithiasis. 7. Left colonic diverticulosis without diverticulitis. 8.  Emphysema. (ICD10-J43.9) 9.  Aortic Atherosclerois (ICD10-170.0) Electronically Signed   By: Misty Stanley M.D.   On: 01/30/2021 09:59   DG Knee Complete 4 Views Left  Result Date: 01/05/2021 CLINICAL DATA:  Bilateral knee pain EXAM: LEFT KNEE - COMPLETE 4+ VIEW COMPARISON:  None. FINDINGS: Normal alignment. No fracture or dislocation. Mild medial compartment joint space narrowing in keeping with minimal degenerative arthritis in this location. No effusion. Soft tissues are unremarkable. IMPRESSION: Minimal medial compartment degenerative arthritis. Electronically Signed   By: Fidela Salisbury MD   On: 01/05/2021 02:10   DG Knee Complete 4 Views Right  Result Date: 01/05/2021 CLINICAL DATA:  Bilateral knee pain EXAM: RIGHT KNEE - COMPLETE 4+ VIEW COMPARISON:  None. FINDINGS: Normal alignment. No fracture or dislocation. Mild degenerative chondrocalcinosis of the medial and lateral menisci. No effusion. Soft tissues are unremarkable. IMPRESSION: Mild degenerative chondrocalcinosis. No acute fracture or dislocation. Electronically Signed   By: Fidela Salisbury MD   On: 01/05/2021 02:10    ASSESSMENT AND PLAN: This is a 75 years old African-American female with highly suspicious stage IIIb (T3, N2, M0) lung cancer pending tissue diagnosis and presented with large left lower lobe lung mass in addition to left upper lobe lung nodule as well as left hilar and mediastinal lymphadenopathy. The patient had a PET scan and MRI of the brain performed recently.  I personally and independently reviewed the scan images and  discussed the result and showed the images to the patient today. Her scan showed the above findings but no other evidence of metastatic disease outside the chest. I had a lengthy discussion with the patient today about her condition and potential treatment options based on the pathology.  I strongly recommend for the patient to proceed with the bronchoscopy and endobronchial ultrasound as recommended by Dr. Lamonte Sakai.  I explained to the patient that if she has non-small cell lung cancer she would likely benefit from a course of concurrent chemoradiation with weekly carboplatin and paclitaxel followed by consolidation immunotherapy. The patient is not a surgical candidate for resection regardless of the histology. The patient is very reluctant about proceeding with any treatment and we had a lengthy discussion about her prognosis with and without treatment. She would like some time to think about her options.  She will call Dr. Lamonte Sakai if she decided to proceed with the bronchoscopy and tissue diagnosis. Her other option would be referral to palliative care and hospice by her primary care physician if she decided against any future treatment. The patient was advised to call if she has any other concerning issues in the interval. The patient voices understanding of current disease status and treatment options and is in agreement with the current care plan.  All questions were answered. The patient knows to call the clinic with any problems, questions or concerns. We can certainly see  the patient much sooner if necessary.  The total time spent in the appointment was 55 minutes.  Disclaimer: This note was dictated with voice recognition software. Similar sounding words can inadvertently be transcribed and may not be corrected upon review.

## 2021-02-07 ENCOUNTER — Other Ambulatory Visit: Payer: Self-pay

## 2021-02-07 ENCOUNTER — Ambulatory Visit: Payer: Medicare Other | Admitting: Emergency Medicine

## 2021-02-07 ENCOUNTER — Encounter: Payer: Self-pay | Admitting: Emergency Medicine

## 2021-02-07 ENCOUNTER — Telehealth: Payer: Self-pay | Admitting: Emergency Medicine

## 2021-02-07 VITALS — BP 112/70 | HR 75 | Temp 98.3°F | Ht 62.0 in | Wt 140.2 lb

## 2021-02-07 DIAGNOSIS — C3432 Malignant neoplasm of lower lobe, left bronchus or lung: Secondary | ICD-10-CM | POA: Diagnosis not present

## 2021-02-07 NOTE — H&P (View-Only) (Signed)
   Subjective:    Patient ID: Stacey Harmon, female    DOB: 27-Feb-1946, 75 y.o.   MRN: [id number]  HPI 75 year old former smoker (60 pack years) with a history of hypertension, cirrhosis (imuran, prednisone).  She suffered a fall on 01/05/2021 and went to the emergency department with rib cage pain.  Imaging revealed a left lower lobe mass on chest x-ray and then CT scan as below.  She is referred now for further evaluation abnormal CT. She is feeling fairly well currently, is having some cough. Prod of thin white mucous.   CT scan of the chest performed 01/05/2021 reviewed by me shows a 3.8 cm left lower lobe mass consistent with malignancy with associated left hilar and subcarinal lymphadenopathy.    ROV 02/07/21 --this is a follow-up visit for Stacey Harmon, former smoker as above with hypertension, cirrhosis.  She has a left lower lobe mass 3.8 cm with some associated left hilar and subcarinal lymphadenopathy.  She has had some significant hesitation regarding whether she would want treatment, chemo, XRT. Has discussed w me and w Dr Julien Nordmann. She is having   MRI brain 01/27/2021 reviewed by me shows no evidence of intracranial metastatic disease  PET scan performed 01/27/2021 reviewed by me showed marked hypermetabolic activity in the left lower lobe mass with hyper metabolism in the left hilar lymphadenopathy, subcarinal node.  There is a 12 mm irregular left upper lobe pulmonary nodule that is also hypermetabolic, probably metastatic disease.  Hypermetabolism in the anterior fifth rib at the site of her rib fracture.    Review of Systems As per HPI     Objective:   Physical Exam Vitals:   02/07/21 1450  BP: 112/70  Pulse: 75  Temp: 98.3 F (36.8 C)  TempSrc: Oral  SpO2: 97%  Weight: 140 lb 3.2 oz (63.6 kg)  Height: 5\' 2"  (1.575 m)   Gen: Pleasant, well-nourished, in no distress,  normal affect  ENT: No lesions,  mouth clear,  oropharynx clear, no postnasal drip  Neck: No JVD, no  stridor  Lungs: No use of accessory muscles, soft left-sided rhonchi, no wheeze or crackles  Cardiovascular: RRR, heart sounds normal, no murmur or gallops, no peripheral edema  Musculoskeletal: No deformities, no cyanosis or clubbing  Neuro: alert, awake, non focal  Skin: Warm, no lesions or rash      Assessment & Plan:  Malignant neoplasm of lower lobe of left lung ([place]) She remains quite hesitant regarding possible treatment and therefore tissue diagnosis.  I did explain to her that Dr. Julien Nordmann will not be able to give her specific information about the available therapeutic options until we have a tissue diagnosis.  She is willing to go forward with bronchoscopy.  She will need robotic assisted navigation as well as endobronchial ultrasound.  I will work on getting this scheduled for 9/26 or 9/19.  Explained to her that she will need transportation and someone to watch her afterwards.  She has not talked to her family about the lung mass yet.  She will need to find someone in whom she can confide so that we can get her to and from the procedure safely.   Baltazar Apo, MD, PhD 02/07/2021, 3:23 PM [facility] [phone] or if no answer before 7:00PM call [phone] For any issues after 7:00PM please call eLink [phone]

## 2021-02-07 NOTE — Telephone Encounter (Signed)
Pt informed of the following:   Covid test 9/23 at between 8-3  Bronch 9/26 at 7:30am; 5:30am arrival time @ [facility].  CT from 8/4 being converted to Super D Disk and sent to [facility] from [place].

## 2021-02-07 NOTE — Patient Instructions (Addendum)
We will work on setting up bronchoscopy to evaluate your left lung and enlarged lymph nodes.  This will be done as an outpatient at [facility] under general anesthesia.  You will need a designated driver and you will need someone to stay with you at home overnight on the date of the procedure. Follow with Dr Lamonte Sakai in 1 month

## 2021-02-07 NOTE — Progress Notes (Signed)
   Subjective:    Patient ID: Stacey Harmon, female    DOB: March 11, 1946, 74 y.o.   MRN: [id number]  HPI 75 year old former smoker (60 pack years) with a history of hypertension, cirrhosis (imuran, prednisone).  She suffered a fall on 01/05/2021 and went to the emergency department with rib cage pain.  Imaging revealed a left lower lobe mass on chest x-ray and then CT scan as below.  She is referred now for further evaluation abnormal CT. She is feeling fairly well currently, is having some cough. Prod of thin white mucous.   CT scan of the chest performed 01/05/2021 reviewed by me shows a 3.8 cm left lower lobe mass consistent with malignancy with associated left hilar and subcarinal lymphadenopathy.    ROV 02/07/21 --this is a follow-up visit for Stacey Harmon, former smoker as above with hypertension, cirrhosis.  She has a left lower lobe mass 3.8 cm with some associated left hilar and subcarinal lymphadenopathy.  She has had some significant hesitation regarding whether she would want treatment, chemo, XRT. Has discussed w me and w Dr Julien Nordmann. She is having   MRI brain 01/27/2021 reviewed by me shows no evidence of intracranial metastatic disease  PET scan performed 01/27/2021 reviewed by me showed marked hypermetabolic activity in the left lower lobe mass with hyper metabolism in the left hilar lymphadenopathy, subcarinal node.  There is a 12 mm irregular left upper lobe pulmonary nodule that is also hypermetabolic, probably metastatic disease.  Hypermetabolism in the anterior fifth rib at the site of her rib fracture.    Review of Systems As per HPI     Objective:   Physical Exam Vitals:   02/07/21 1450  BP: 112/70  Pulse: 75  Temp: 98.3 F (36.8 C)  TempSrc: Oral  SpO2: 97%  Weight: 140 lb 3.2 oz (63.6 kg)  Height: 5\' 2"  (1.575 m)   Gen: Pleasant, well-nourished, in no distress,  normal affect  ENT: No lesions,  mouth clear,  oropharynx clear, no postnasal drip  Neck: No JVD, no  stridor  Lungs: No use of accessory muscles, soft left-sided rhonchi, no wheeze or crackles  Cardiovascular: RRR, heart sounds normal, no murmur or gallops, no peripheral edema  Musculoskeletal: No deformities, no cyanosis or clubbing  Neuro: alert, awake, non focal  Skin: Warm, no lesions or rash      Assessment & Plan:  Malignant neoplasm of lower lobe of left lung ([place]) She remains quite hesitant regarding possible treatment and therefore tissue diagnosis.  I did explain to her that Dr. Julien Nordmann will not be able to give her specific information about the available therapeutic options until we have a tissue diagnosis.  She is willing to go forward with bronchoscopy.  She will need robotic assisted navigation as well as endobronchial ultrasound.  I will work on getting this scheduled for 9/26 or 9/19.  Explained to her that she will need transportation and someone to watch her afterwards.  She has not talked to her family about the lung mass yet.  She will need to find someone in whom she can confide so that we can get her to and from the procedure safely.   Baltazar Apo, MD, PhD 02/07/2021, 3:23 PM [facility] [phone] or if no answer before 7:00PM call [phone] For any issues after 7:00PM please call eLink [phone]

## 2021-02-07 NOTE — Assessment & Plan Note (Signed)
She remains quite hesitant regarding possible treatment and therefore tissue diagnosis.  I did explain to her that Dr. Julien Nordmann will not be able to give her specific information about the available therapeutic options until we have a tissue diagnosis.  She is willing to go forward with bronchoscopy.  She will need robotic assisted navigation as well as endobronchial ultrasound.  I will work on getting this scheduled for 9/26 or 9/19.  Explained to her that she will need transportation and someone to watch her afterwards.  She has not talked to her family about the lung mass yet.  She will need to find someone in whom she can confide so that we can get her to and from the procedure safely.

## 2021-02-22 ENCOUNTER — Encounter (HOSPITAL_COMMUNITY): Payer: Self-pay | Admitting: Emergency Medicine

## 2021-02-22 ENCOUNTER — Telehealth: Payer: Self-pay | Admitting: Emergency Medicine

## 2021-02-22 ENCOUNTER — Other Ambulatory Visit: Payer: Self-pay

## 2021-02-22 NOTE — Progress Notes (Signed)
The pt request that whom ever brings her and picks her up to go home from her procedure on Mon 02/27/21 not be notified of the current procedure she had. She has not informed her family of her condition, as yet. She will inform them after she gets the results of the EBUS, so she can tell them her treatment plans at that time.

## 2021-02-22 NOTE — Progress Notes (Addendum)
PCP - Dr. Chauncey Reading  Cardiologist - Denies  EP-Denies  Endocrine-Denies  Pulm-Denies  Chest x-ray - 01/05/21- Ribs  EKG - 02/27/21 (DOS)  Stress Test - Denies  ECHO - Denies  Cardiac Cath - Denies  AICD-na PM-na LOOP-na  Dialysis-Denies  Sleep Study - Denies CPAP - Denies  LABS- 01/31/21 (E): CBC w/D, CMP 02/23/21: COVID @ 1345  ASA-Denies  ERAS- No  HA1C-Denies  Anesthesia- No  Pt denies having chest pain, sob, or fever during the pre-op phone call. All instructions explained to the pt, with a verbal understanding of the material including: as of today, stop taking all Aspirin (unless instructed by your doctor) and Other Aspirin containing products, Vitamins, Fish oils, and Herbal medications. Also stop all NSAIDS i.e. Advil, Ibuprofen, Motrin, Aleve, Anaprox, Naproxen, BC, Goody Powders, and all Supplements. Pt also instructed to wear a mask and social distance  after being tested for COVID-19 on 02/23/21. The opportunity to ask questions was provided.    Coronavirus Screening  Have you experienced the following symptoms:  Cough yes/no: No Fever (>100.10F)  yes/no: No Runny nose yes/no: No Sore throat yes/no: No Difficulty breathing/shortness of breath  yes/no: No  Have you or a family member traveled in the last 14 days and where? yes/no: No   If the patient indicates "YES" to the above questions, their PAT will be rescheduled to limit the exposure to others and, the surgeon will be notified. THE PATIENT WILL NEED TO BE ASYMPTOMATIC FOR 14 DAYS.   If the patient is not experiencing any of these symptoms, the PAT nurse will instruct them to NOT bring anyone with them to their appointment since they may have these symptoms or traveled as well.   Please remind your patients and families that hospital visitation restrictions are in effect and the importance of the restrictions.

## 2021-02-22 NOTE — Telephone Encounter (Signed)
I have called and LM on VM for the pt to call back 

## 2021-02-23 ENCOUNTER — Other Ambulatory Visit (HOSPITAL_COMMUNITY)
Admission: RE | Admit: 2021-02-23 | Discharge: 2021-02-23 | Disposition: A | Discharge status: Home / Self Care | Payer: Medicare Other | Source: Ambulatory Visit | Attending: Emergency Medicine | Admitting: Emergency Medicine

## 2021-02-23 ENCOUNTER — Other Ambulatory Visit (HOSPITAL_COMMUNITY): Payer: Medicare Other

## 2021-02-23 DIAGNOSIS — Z20822 Contact with and (suspected) exposure to covid-19: Secondary | ICD-10-CM | POA: Insufficient documentation

## 2021-02-23 DIAGNOSIS — Z01812 Encounter for preprocedural laboratory examination: Secondary | ICD-10-CM | POA: Insufficient documentation

## 2021-02-23 LAB — SARS CORONAVIRUS 2 (TAT 6-24 HRS): SARS Coronavirus 2: NEGATIVE

## 2021-02-26 NOTE — Anesthesia Preprocedure Evaluation (Addendum)
Anesthesia Evaluation  Patient identified by MRN, date of birth, ID band Patient awake    Reviewed: Allergy & Precautions, NPO status , Patient's Chart, lab work & pertinent test results  History of Anesthesia Complications (+) POST - OP SPINAL HEADACHE  Airway Mallampati: II       Dental   Pulmonary neg pulmonary ROS, former smoker,    breath sounds clear to auscultation       Cardiovascular hypertension,  Rhythm:Regular Rate:Normal     Neuro/Psych    GI/Hepatic negative GI ROS, (+) Hepatitis -  Endo/Other    Renal/GU      Musculoskeletal   Abdominal   Peds  Hematology   Anesthesia Other Findings   Reproductive/Obstetrics                            Anesthesia Physical Anesthesia Plan  ASA: 3  Anesthesia Plan: General   Post-op Pain Management:    Induction: Intravenous  PONV Risk Score and Plan: Ondansetron, Dexamethasone and Midazolam  Airway Management Planned:   Additional Equipment:   Intra-op Plan:   Post-operative Plan: Extubation in OR  Informed Consent: I have reviewed the patients History and Physical, chart, labs and discussed the procedure including the risks, benefits and alternatives for the proposed anesthesia with the patient or authorized representative who has indicated his/her understanding and acceptance.     Dental advisory given  Plan Discussed with: CRNA and Anesthesiologist  Anesthesia Plan Comments:        Anesthesia Quick Evaluation

## 2021-02-27 ENCOUNTER — Encounter (HOSPITAL_COMMUNITY): Payer: Self-pay | Admitting: Emergency Medicine

## 2021-02-27 ENCOUNTER — Ambulatory Visit (HOSPITAL_COMMUNITY): Payer: Medicare Other | Admitting: Certified Registered"

## 2021-02-27 ENCOUNTER — Encounter (HOSPITAL_COMMUNITY)
Admission: RE | Disposition: A | Discharge status: Home / Self Care | Payer: Self-pay | Source: Home / Self Care | Attending: Emergency Medicine

## 2021-02-27 ENCOUNTER — Ambulatory Visit (HOSPITAL_COMMUNITY): Payer: Medicare Other

## 2021-02-27 ENCOUNTER — Ambulatory Visit (HOSPITAL_COMMUNITY)
Admission: RE | Admit: 2021-02-27 | Discharge: 2021-02-27 | Disposition: A | Discharge status: Home / Self Care | Payer: Medicare Other | Attending: Emergency Medicine | Admitting: Emergency Medicine

## 2021-02-27 ENCOUNTER — Other Ambulatory Visit: Payer: Self-pay

## 2021-02-27 DIAGNOSIS — Z87891 Personal history of nicotine dependence: Secondary | ICD-10-CM | POA: Diagnosis not present

## 2021-02-27 DIAGNOSIS — R918 Other nonspecific abnormal finding of lung field: Secondary | ICD-10-CM | POA: Diagnosis not present

## 2021-02-27 DIAGNOSIS — I10 Essential (primary) hypertension: Secondary | ICD-10-CM | POA: Diagnosis not present

## 2021-02-27 DIAGNOSIS — R846 Abnormal cytological findings in specimens from respiratory organs and thorax: Secondary | ICD-10-CM | POA: Insufficient documentation

## 2021-02-27 DIAGNOSIS — C3432 Malignant neoplasm of lower lobe, left bronchus or lung: Secondary | ICD-10-CM | POA: Diagnosis not present

## 2021-02-27 DIAGNOSIS — Z419 Encounter for procedure for purposes other than remedying health state, unspecified: Secondary | ICD-10-CM

## 2021-02-27 DIAGNOSIS — Z9889 Other specified postprocedural states: Secondary | ICD-10-CM

## 2021-02-27 DIAGNOSIS — R590 Localized enlarged lymph nodes: Secondary | ICD-10-CM | POA: Insufficient documentation

## 2021-02-27 DIAGNOSIS — K7460 Unspecified cirrhosis of liver: Secondary | ICD-10-CM | POA: Insufficient documentation

## 2021-02-27 HISTORY — DX: Unspecified cirrhosis of liver: K74.60

## 2021-02-27 HISTORY — PX: BRONCHIAL NEEDLE ASPIRATION BIOPSY: SHX5106

## 2021-02-27 HISTORY — PX: BRONCHIAL BIOPSY: SHX5109

## 2021-02-27 HISTORY — PX: VIDEO BRONCHOSCOPY WITH ENDOBRONCHIAL ULTRASOUND: SHX6177

## 2021-02-27 HISTORY — PX: BRONCHIAL BRUSHINGS: SHX5108

## 2021-02-27 HISTORY — PX: VIDEO BRONCHOSCOPY WITH ENDOBRONCHIAL NAVIGATION: SHX6175

## 2021-02-27 HISTORY — PX: VIDEO BRONCHOSCOPY WITH RADIAL ENDOBRONCHIAL ULTRASOUND: SHX6849

## 2021-02-27 LAB — PROTIME-INR
INR: 1.1 (ref 0.8–1.2)
Prothrombin Time: 14.4 seconds (ref 11.4–15.2)

## 2021-02-27 LAB — APTT: aPTT: 35 seconds (ref 24–36)

## 2021-02-27 SURGERY — VIDEO BRONCHOSCOPY WITH ENDOBRONCHIAL NAVIGATION
Anesthesia: General

## 2021-02-27 MED ORDER — ONDANSETRON HCL 4 MG/2ML IJ SOLN
INTRAMUSCULAR | Status: DC | PRN
  Administered 2021-02-27: 4 mg via INTRAVENOUS

## 2021-02-27 MED ORDER — DEXAMETHASONE SODIUM PHOSPHATE 10 MG/ML IJ SOLN
INTRAMUSCULAR | Status: DC | PRN
  Administered 2021-02-27: 10 mg via INTRAVENOUS

## 2021-02-27 MED ORDER — SUCCINYLCHOLINE CHLORIDE 200 MG/10ML IV SOSY
PREFILLED_SYRINGE | INTRAVENOUS | Status: DC | PRN
  Administered 2021-02-27: 140 mg via INTRAVENOUS

## 2021-02-27 MED ORDER — FENTANYL CITRATE (PF) 100 MCG/2ML IJ SOLN
INTRAMUSCULAR | Status: DC | PRN
  Administered 2021-02-27 (×2): 50 ug via INTRAVENOUS

## 2021-02-27 MED ORDER — FENTANYL CITRATE (PF) 100 MCG/2ML IJ SOLN: 25.0000 ug | INTRAMUSCULAR | Status: DC | PRN

## 2021-02-27 MED ORDER — PHENYLEPHRINE 40 MCG/ML (10ML) SYRINGE FOR IV PUSH (FOR BLOOD PRESSURE SUPPORT)
PREFILLED_SYRINGE | INTRAVENOUS | Status: DC | PRN
  Administered 2021-02-27 (×2): 80 ug via INTRAVENOUS
  Administered 2021-02-27 (×3): 40 ug via INTRAVENOUS
  Administered 2021-02-27: 80 ug via INTRAVENOUS
  Administered 2021-02-27: 120 ug via INTRAVENOUS
  Administered 2021-02-27: 40 ug via INTRAVENOUS
  Administered 2021-02-27: 120 ug via INTRAVENOUS

## 2021-02-27 MED ORDER — LACTATED RINGERS IV SOLN: INTRAVENOUS | Status: DC

## 2021-02-27 MED ORDER — LIDOCAINE 2% (20 MG/ML) 5 ML SYRINGE
INTRAMUSCULAR | Status: DC | PRN
  Administered 2021-02-27: 100 mg via INTRAVENOUS

## 2021-02-27 MED ORDER — PROPOFOL 10 MG/ML IV BOLUS
INTRAVENOUS | Status: DC | PRN
  Administered 2021-02-27: 150 mg via INTRAVENOUS

## 2021-02-27 MED ORDER — EPHEDRINE SULFATE-NACL 50-0.9 MG/10ML-% IV SOSY
PREFILLED_SYRINGE | INTRAVENOUS | Status: DC | PRN
  Administered 2021-02-27: 10 mg via INTRAVENOUS

## 2021-02-27 MED ORDER — PHENYLEPHRINE HCL-NACL 20-0.9 MG/250ML-% IV SOLN
INTRAVENOUS | Status: DC | PRN
  Administered 2021-02-27: 30 ug/min via INTRAVENOUS

## 2021-02-27 MED ORDER — CHLORHEXIDINE GLUCONATE 0.12 % MT SOLN
OROMUCOSAL | Status: AC
  Administered 2021-02-27: 15 mL
  Filled 2021-02-27: qty 15

## 2021-02-27 MED ORDER — SUGAMMADEX SODIUM 200 MG/2ML IV SOLN
INTRAVENOUS | Status: DC | PRN
  Administered 2021-02-27: 200 mg via INTRAVENOUS

## 2021-02-27 MED ORDER — MIDAZOLAM HCL 2 MG/2ML IJ SOLN
INTRAMUSCULAR | Status: DC | PRN
  Administered 2021-02-27: 1 mg via INTRAVENOUS

## 2021-02-27 MED ORDER — ROCURONIUM BROMIDE 10 MG/ML (PF) SYRINGE
PREFILLED_SYRINGE | INTRAVENOUS | Status: DC | PRN
  Administered 2021-02-27: 20 mg via INTRAVENOUS
  Administered 2021-02-27: 40 mg via INTRAVENOUS
  Administered 2021-02-27: 10 mg via INTRAVENOUS

## 2021-02-27 NOTE — Telephone Encounter (Signed)
Covid test already done 9/22 will close encounter

## 2021-02-27 NOTE — Anesthesia Postprocedure Evaluation (Signed)
Anesthesia Post Note  Patient: Stacey Harmon  Procedure(s) Performed: ROBOTIC VIDEO BRONCHOSCOPY WITH ENDOBRONCHIAL NAVIGATION VIDEO BRONCHOSCOPY WITH ENDOBRONCHIAL ULTRASOUND BRONCHIAL BRUSHINGS BRONCHIAL NEEDLE ASPIRATION BIOPSIES BRONCHIAL BIOPSIES VIDEO BRONCHOSCOPY WITH RADIAL ENDOBRONCHIAL ULTRASOUND     Patient location during evaluation: PACU Anesthesia Type: General Level of consciousness: awake Pain management: pain level controlled Vital Signs Assessment: post-procedure vital signs reviewed and stable Respiratory status: spontaneous breathing Cardiovascular status: stable Postop Assessment: no apparent nausea or vomiting Anesthetic complications: no   No notable events documented.  Last Vitals:  Vitals:   02/27/21 0955 02/27/21 1010  BP: 104/68 101/68  Pulse: 74 70  Resp: 17 19  Temp:  (!) 36.2 C  SpO2: 93% 94%    Last Pain:  Vitals:   02/27/21 1010  TempSrc:   PainSc: 0-No pain                 Otila Starn

## 2021-02-27 NOTE — Anesthesia Procedure Notes (Signed)
Procedure Name: Intubation Date/Time: 02/27/2021 7:46 AM Performed by: Moshe Salisbury, CRNA Pre-anesthesia Checklist: Patient identified, Suction available, Emergency Drugs available, Patient being monitored and Timeout performed Patient Re-evaluated:Patient Re-evaluated prior to induction Oxygen Delivery Method: Circle system utilized Preoxygenation: Pre-oxygenation with 100% oxygen Induction Type: IV induction Ventilation: Mask ventilation without difficulty Laryngoscope Size: Miller and 2 Grade View: Grade I Tube type: Oral Tube size: 8.5 mm Number of attempts: 1 Airway Equipment and Method: Stylet Placement Confirmation: ETT inserted through vocal cords under direct vision, positive ETCO2, CO2 detector and breath sounds checked- equal and bilateral Secured at: 22 cm Tube secured with: Tape Dental Injury: Teeth and Oropharynx as per pre-operative assessment

## 2021-02-27 NOTE — Transfer of Care (Signed)
Immediate Anesthesia Transfer of Care Note  Patient: Stacey Harmon  Procedure(s) Performed: ROBOTIC VIDEO BRONCHOSCOPY WITH ENDOBRONCHIAL NAVIGATION VIDEO BRONCHOSCOPY WITH ENDOBRONCHIAL ULTRASOUND BRONCHIAL BRUSHINGS BRONCHIAL NEEDLE ASPIRATION BIOPSIES BRONCHIAL BIOPSIES  Patient Location: PACU  Anesthesia Type:General  Level of Consciousness: drowsy and patient cooperative  Airway & Oxygen Therapy: Patient Spontanous Breathing and Patient connected to face mask oxygen  Post-op Assessment: Report given to RN, Post -op Vital signs reviewed and stable and Patient moving all extremities  Post vital signs: Reviewed and stable  Last Vitals:  Vitals Value Taken Time  BP    Temp    Pulse 72 02/27/21 0947  Resp 17 02/27/21 0947  SpO2 99 % 02/27/21 0947  Vitals shown include unvalidated device data.  Last Pain:  Vitals:   02/27/21 0641  TempSrc:   PainSc: 0-No pain      Patients Stated Pain Goal: 3 (60/15/61 5379)  Complications: No notable events documented.

## 2021-02-27 NOTE — Op Note (Signed)
Video Bronchoscopy with Robotic Assisted Bronchoscopic Navigation, Endobronchial Ultrasound and biopsies  Date of Operation: 02/27/2021   Pre-op Diagnosis: Left lower lobe mass, left upper lobe nodule, mediastinal adenopathy  Post-op Diagnosis: Same  Surgeon: Baltazar Apo  Assistants: None  Anesthesia: General endotracheal anesthesia  Operation: Flexible video fiberoptic bronchoscopy with robotic assistance and biopsies.  Estimated Blood Loss: Minimal  Complications: None  Indications and History: Stacey Harmon is a 75 y.o. female with history of tobacco use.  She was found to have an abnormal CT chest with left lower lobe mass, left upper lobe nodule, mediastinal adenopathy that were hypermetabolic by PET scan.  Recommendation was made to achieve tissue diagnosis via navigational bronchoscopy and endobronchial ultrasound. The risks, benefits, complications, treatment options and expected outcomes were discussed with the patient.  The possibilities of pneumothorax, pneumonia, reaction to medication, pulmonary aspiration, perforation of a viscus, bleeding, failure to diagnose a condition and creating a complication requiring transfusion or operation were discussed with the patient who freely signed the consent.    Description of Procedure: The patient was seen in the Preoperative Area, was examined and was deemed appropriate to proceed.  The patient was taken to [facility] endoscopy room 3, identified as Hoyle Barr and the procedure verified as Flexible Video Fiberoptic Bronchoscopy.  A Time Out was held and the above information confirmed.   Prior to the date of the procedure a high-resolution CT scan of the chest was performed. Utilizing ION software program a virtual tracheobronchial tree was generated to allow the creation of distinct navigation pathways to the patient's parenchymal abnormalities. After being taken to the operating room general anesthesia was initiated and the patient  was  orally intubated. The video fiberoptic bronchoscope was introduced via the endotracheal tube and a general inspection was performed which showed normal right and left lung anatomy, aspiration of the bilateral mainstems was completed to remove any remaining secretions. Robotic catheter inserted into patient's endotracheal tube.   Target #1 Left Lower Lobe Mass: The distinct navigation pathways prepared prior to this procedure were then utilized to navigate to patient's lesion identified on CT scan.  Endobronchial lesion was visible in the left lower lobe via the vision probe.  The robotic catheter was secured into place and the vision probe was withdrawn.  Lesion location was approximated using fluoroscopy and radial endobronchial ultrasound for peripheral targeting. Under fluoroscopic guidance transbronchial brushings, transbronchial needle biopsies, and transbronchial forceps biopsies were performed to be sent for cytology and pathology.   The robotic scope was then withdrawn and the endobronchial ultrasound was used to identify and characterize the peritracheal, hilar and bronchial lymph nodes. Inspection showed irregularity and enlargement at station 7, station 11 L. Using real-time ultrasound guidance Wang needle biopsies were take from Station 7 and 11 L nodes and were sent for cytology.   At the end of the procedure a general airway inspection was performed and there was no evidence of active bleeding. The bronchoscope was removed.  The patient tolerated the procedure well. There was no significant blood loss and there were no obvious complications. A post-procedural chest x-ray is pending.  Samples Target #1, left lower lobe mass: 1. Transbronchial brushings from left lower lobe mass 2. Transbronchial Wang needle biopsies from left lower lobe mass 3. Transbronchial forceps biopsies from left lower lobe mass  EBUS samples: 1. Wang needle biopsies from 7 node 2. Wang needle biopsies from 11 L  node   Plans:  The patient will be discharged from  the PACU to home when recovered from anesthesia and after chest x-ray is reviewed. We will review the cytology, pathology and microbiology results with the patient when they become available. Outpatient followup will be with Dr. Lamonte Sakai.     Baltazar Apo, MD, PhD 02/27/2021, 9:38 AM [facility] [phone] or if no answer before 7:00PM call [phone] For any issues after 7:00PM please call eLink [phone]

## 2021-02-27 NOTE — Interval H&P Note (Signed)
History and Physical Interval Note:  02/27/2021 7:24 AM  Stacey Harmon  has presented today for surgery, with the diagnosis of LOWER LOBE MASS,MEDIASTINAL ADENOPATHY.  The various methods of treatment have been discussed with the patient and family. After consideration of risks, benefits and other options for treatment, the patient has consented to  Procedure(s): ROBOTIC [place] (N/A) VIDEO BRONCHOSCOPY WITH ENDOBRONCHIAL ULTRASOUND (N/A) as a surgical intervention.  [name] history has been reviewed, patient examined, no change in status, stable for surgery.  I have reviewed the patient's chart and labs.  Questions were answered to the patient's satisfaction.     Collene Gobble

## 2021-02-27 NOTE — Discharge Instructions (Signed)
Flexible Bronchoscopy, Care After This sheet gives you information about how to care for yourself after your test. Your doctor may also give you more specific instructions. If you have problems or questions, contact your doctor. Follow these instructions at home: Eating and drinking Do not eat or drink anything (not even water) for 2 hours after your test, or until your numbing medicine (local anesthetic) wears off. When your numbness is gone and your cough and gag reflexes have come back, you may: Eat only soft foods. Slowly drink liquids. The day after the test, go back to your normal diet. Driving Do not drive for 24 hours if you were given a medicine to help you relax (sedative). Do not drive or use heavy machinery while taking prescription pain medicine. General instructions  Take over-the-counter and prescription medicines only as told by your doctor. Return to your normal activities as told. Ask what activities are safe for you. Do not use any products that have nicotine or tobacco in them. This includes cigarettes and e-cigarettes. If you need help quitting, ask your doctor. Keep all follow-up visits as told by your doctor. This is important. It is very important if you had a tissue sample (biopsy) taken. Get help right away if: You have shortness of breath that gets worse. You get light-headed. You feel like you are going to pass out (faint). You have chest pain. You cough up: More than a little blood. More blood than before. Summary Do not eat or drink anything (not even water) for 2 hours after your test, or until your numbing medicine wears off. Do not use cigarettes. Do not use e-cigarettes. Get help right away if you have chest pain.  Please call our office for any questions or concerns.  [phone].  This information is not intended to replace advice given to you by your health care provider. Make sure you discuss any questions you have with your health care  provider. Document Released: 03/18/2009 Document Revised: 05/03/2017 Document Reviewed: 06/08/2016 Elsevier Patient Education  2020 [employer].

## 2021-03-01 ENCOUNTER — Encounter (HOSPITAL_COMMUNITY): Payer: Self-pay | Admitting: Emergency Medicine

## 2021-03-01 ENCOUNTER — Telehealth: Payer: Self-pay | Admitting: Emergency Medicine

## 2021-03-01 LAB — CYTOLOGY - NON PAP

## 2021-03-01 NOTE — Telephone Encounter (Signed)
Spoke with patient to review bx results > shows squamous cell in LLL mass and also station 7 node.  She will follow with Dr Julien Nordmann. Will send this info to him [facility]

## 2021-03-03 ENCOUNTER — Telehealth: Payer: Self-pay | Admitting: *Deleted

## 2021-03-03 DIAGNOSIS — C3432 Malignant neoplasm of lower lobe, left bronchus or lung: Secondary | ICD-10-CM

## 2021-03-03 NOTE — Telephone Encounter (Signed)
I followed up on Stacey Harmon's treatment plan. She had her scans and bx. I called her today to set her up for a follow up with Dr. Julien Nordmann next week. She verbalized understanding of appt time and place.

## 2021-03-07 ENCOUNTER — Inpatient Hospital Stay: Payer: Medicare Other

## 2021-03-07 ENCOUNTER — Inpatient Hospital Stay: Payer: Medicare Other | Attending: Internal Medicine | Admitting: Internal Medicine

## 2021-03-07 ENCOUNTER — Other Ambulatory Visit: Payer: Self-pay

## 2021-03-07 VITALS — BP 121/84 | HR 77 | Temp 97.1°F | Resp 18 | Ht 63.0 in | Wt 137.2 lb

## 2021-03-07 DIAGNOSIS — C3432 Malignant neoplasm of lower lobe, left bronchus or lung: Secondary | ICD-10-CM

## 2021-03-07 DIAGNOSIS — Z5111 Encounter for antineoplastic chemotherapy: Secondary | ICD-10-CM | POA: Insufficient documentation

## 2021-03-07 LAB — CBC WITH DIFFERENTIAL (CANCER CENTER ONLY)
Abs Immature Granulocytes: 0.01 10*3/uL (ref 0.00–0.07)
Basophils Absolute: 0.1 10*3/uL (ref 0.0–0.1)
Basophils Relative: 1 %
Eosinophils Absolute: 0.1 10*3/uL (ref 0.0–0.5)
Eosinophils Relative: 1 %
HCT: 36.7 % (ref 36.0–46.0)
Hemoglobin: 12.7 g/dL (ref 12.0–15.0)
Immature Granulocytes: 0 %
Lymphocytes Relative: 8 %
Lymphs Abs: 0.7 10*3/uL (ref 0.7–4.0)
MCH: 33 pg (ref 26.0–34.0)
MCHC: 34.6 g/dL (ref 30.0–36.0)
MCV: 95.3 fL (ref 80.0–100.0)
Monocytes Absolute: 0.7 10*3/uL (ref 0.1–1.0)
Monocytes Relative: 8 %
Neutro Abs: 7 10*3/uL (ref 1.7–7.7)
Neutrophils Relative %: 82 %
Platelet Count: 307 10*3/uL (ref 150–400)
RBC: 3.85 MIL/uL — ABNORMAL LOW (ref 3.87–5.11)
RDW: 13.4 % (ref 11.5–15.5)
WBC Count: 8.6 10*3/uL (ref 4.0–10.5)
nRBC: 0 % (ref 0.0–0.2)

## 2021-03-07 LAB — CMP (CANCER CENTER ONLY)
ALT: 8 U/L (ref 0–44)
AST: 18 U/L (ref 15–41)
Albumin: 3.1 g/dL — ABNORMAL LOW (ref 3.5–5.0)
Alkaline Phosphatase: 43 U/L (ref 38–126)
Anion gap: 9 (ref 5–15)
BUN: 14 mg/dL (ref 8–23)
CO2: 22 mmol/L (ref 22–32)
Calcium: 9.3 mg/dL (ref 8.9–10.3)
Chloride: 104 mmol/L (ref 98–111)
Creatinine: 0.77 mg/dL (ref 0.44–1.00)
GFR, Estimated: >60 mL/min (ref >60)
Glucose, Bld: 99 mg/dL (ref 70–99)
Potassium: 4.4 mmol/L (ref 3.5–5.1)
Sodium: 135 mmol/L (ref 135–145)
Total Bilirubin: 1.3 mg/dL — ABNORMAL HIGH (ref 0.3–1.2)
Total Protein: 6.9 g/dL (ref 6.5–8.1)

## 2021-03-07 NOTE — Progress Notes (Signed)
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  DETECTED ALTERATION(S) / BIOMARKER(S) % CFDNA OR AMPLIFICATION ASSOCIATED FDA-APPROVED THERAPIES CLINICAL TRIAL AVAILABILITY [id number] Site SNV 2.9% None  Yes [id number] 3.1% None   PRIOR THERAPY: None  CURRENT THERAPY: None  INTERVAL HISTORY: Stacey Harmon 75 y.o. female returns to the clinic today for follow-up visit.  The patient is feeling fine today with no concerning complaints except for mild cough and shortness of breath after the bronchoscopy.  She denied having any chest pain or hemoptysis.  She denied having any recent weight loss or night sweats.  She has no nausea, vomiting, diarrhea or constipation.  She has no headache or visual changes.  She denied having any fever or chills.  The patient had bronchoscopy with endobronchial ultrasound and biopsy under the care of Dr. Lamonte Sakai on February 27, 2021.  She is here today for evaluation and discussion of her treatment options based on the biopsy results.  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  MEDICATIONS:  Current Outpatient Medications  Medication Sig Dispense Refill   ALPRAZolam (XANAX) 0.5 MG tablet Take 0.25 mg by mouth daily.     amLODipine (NORVASC) 10 MG tablet Take 10 mg by mouth daily.       azaTHIOprine (IMURAN) 50 MG tablet Take 125 mg by mouth daily.      bimatoprost (LUMIGAN) 0.03 % ophthalmic solution Place 1 drop into both eyes at bedtime.     brimonidine-timolol (COMBIGAN) 0.2-0.5 %  ophthalmic solution Place 1 drop into both eyes every 12 (twelve) hours.     Multiple Vitamins-Calcium (ONE-A-DAY WOMENS PO) Take 1 tablet by mouth daily.       NYSTATIN powder Apply 1 application topically daily as needed (sweat under breast).     Omega-3 Fatty Acids (FISH OIL) 1000 MG CAPS Take 1,000 mg by mouth daily.     Polyvinyl Alcohol (LUBRICANT DROPS OP) Place 1 drop into both eyes daily as needed (Dry eyes).     predniSONE (DELTASONE) 5 MG tablet Take 5 mg by mouth daily.     Promethazine HCl 6.25 MG/5ML SOLN Take 5 mLs by mouth every 4 (four) hours as needed for cough.     triamterene-hydrochlorothiazide (MAXZIDE-25) 37.5-25 MG per tablet Take 1 tablet by mouth daily.     No current facility-administered medications for this visit.    SURGICAL HISTORY:  Past Surgical History:  Procedure Laterality Date   BLADDER SURGERY     BRONCHIAL BIOPSY  02/27/2021   Procedure: BRONCHIAL BIOPSIES;  Surgeon: Collene Gobble, MD;  Location: [facility] ENDOSCOPY;  Service: Pulmonary;;   BRONCHIAL BRUSHINGS  02/27/2021   Procedure: BRONCHIAL BRUSHINGS;  Surgeon: Collene Gobble, MD;  Location: [facility] ENDOSCOPY;  Service: Pulmonary;;   BRONCHIAL NEEDLE ASPIRATION BIOPSY  02/27/2021   Procedure: BRONCHIAL NEEDLE ASPIRATION BIOPSIES;  Surgeon: Collene Gobble, MD;  Location: [facility] ENDOSCOPY;  Service: Pulmonary;;   COLONOSCOPY     VIDEO BRONCHOSCOPY WITH ENDOBRONCHIAL NAVIGATION N/A 02/27/2021   Procedure: ROBOTIC VIDEO BRONCHOSCOPY  WITH ENDOBRONCHIAL NAVIGATION;  Surgeon: Collene Gobble, MD;  Location: [facility] ENDOSCOPY;  Service: Pulmonary;  Laterality: N/A;   VIDEO BRONCHOSCOPY WITH ENDOBRONCHIAL ULTRASOUND N/A 02/27/2021   Procedure: VIDEO BRONCHOSCOPY WITH ENDOBRONCHIAL ULTRASOUND;  Surgeon: Collene Gobble, MD;  Location: [facility] ENDOSCOPY;  Service: Pulmonary;  Laterality: N/A;   VIDEO BRONCHOSCOPY WITH RADIAL ENDOBRONCHIAL ULTRASOUND  02/27/2021   Procedure: VIDEO BRONCHOSCOPY WITH RADIAL ENDOBRONCHIAL ULTRASOUND;   Surgeon: Collene Gobble, MD;  Location: [facility] ENDOSCOPY;  Service: Pulmonary;;    REVIEW OF SYSTEMS:  Constitutional: positive for fatigue Eyes: negative Ears, nose, mouth, throat, and face: negative Respiratory: positive for cough and dyspnea on exertion Cardiovascular: negative Gastrointestinal: negative Genitourinary:negative Integument/breast: negative Hematologic/lymphatic: negative Musculoskeletal:negative Neurological: negative Behavioral/Psych: negative Endocrine: negative Allergic/Immunologic: negative   PHYSICAL EXAMINATION: General appearance: alert, cooperative, fatigued, and no distress Head: Normocephalic, without obvious abnormality, atraumatic Neck: no adenopathy, no JVD, supple, symmetrical, trachea midline, and thyroid not enlarged, symmetric, no tenderness/mass/nodules Lymph nodes: Cervical, supraclavicular, and axillary nodes normal. Resp: clear to auscultation bilaterally Back: symmetric, no curvature. ROM normal. No CVA tenderness. Cardio: regular rate and rhythm, S1, S2 normal, no murmur, click, rub or gallop GI: soft, non-tender; bowel sounds normal; no masses,  no organomegaly Extremities: extremities normal, atraumatic, no cyanosis or edema Neurologic: Alert and oriented X 3, normal strength and tone. Normal symmetric reflexes. Normal coordination and gait  ECOG PERFORMANCE STATUS: 1 - Symptomatic but completely ambulatory  Blood pressure 121/84, pulse 77, temperature (!) 97.1 F (36.2 C), temperature source Tympanic, resp. rate 18, height 5\' 3"  (1.6 m), weight 137 lb 3.2 oz (62.2 kg), SpO2 94 %.  LABORATORY DATA: Lab Results  Component Value Date   WBC 8.6 03/07/2021   HGB 12.7 03/07/2021   HCT 36.7 03/07/2021   MCV 95.3 03/07/2021   PLT 307 03/07/2021      Chemistry      Component Value Date/Time   NA 140 01/31/2021 1000   K 4.4 01/31/2021 1000   CL 105 01/31/2021 1000   CO2 26 01/31/2021 1000   BUN 18 01/31/2021 1000   CREATININE 0.86  01/31/2021 1000      Component Value Date/Time   CALCIUM 10.0 01/31/2021 1000   ALKPHOS 55 01/31/2021 1000   AST 27 01/31/2021 1000   ALT 25 01/31/2021 1000   BILITOT 1.5 (H) 01/31/2021 1000       RADIOGRAPHIC STUDIES: DG Chest Port 1 View  Result Date: 02/27/2021 CLINICAL DATA:  Post bronchoscopy with biopsy EXAM: PORTABLE CHEST 1 VIEW COMPARISON:  Portable exam 0959 hours compared to 01/05/2021 FINDINGS: Normal heart size, mediastinal contours, and pulmonary vascularity. Small nodular density LEFT upper lobe unchanged. Increased infiltrates surrounding nodular density seen previously at the LEFT base. Central peribronchial thickening again seen. No pleural effusion or pneumothorax. Bones demineralized. IMPRESSION: Stable LEFT upper lobe and LEFT basilar nodular densities with mild surrounding infiltrate at the nodule at the LEFT base, may be related to bronchoscopy either representing mild hemorrhage or edema. No pneumothorax. Electronically Signed   By: Lavonia Dana M.D.   On: 02/27/2021 10:20   DG C-ARM BRONCHOSCOPY  Result Date: 02/27/2021 C-ARM BRONCHOSCOPY: Fluoroscopy was utilized by the requesting physician.  No radiographic interpretation.    ASSESSMENT AND PLAN: This is a 75 years old African-American female with highly suspicious stage IIIb (T3, N2, M0) non-small cell lung cancer, squamous cell carcinoma diagnosed in September 2022 and presented with large left lower lobe lung mass in addition to left upper lobe lung  nodule as well as left hilar and mediastinal lymphadenopathy. I had a lengthy discussion with the patient today about her current disease stage, prognosis and treatment options. I recommended for the patient a course of concurrent chemoradiation with weekly carboplatin for AUC of 2 and paclitaxel 45 Mg/M2 for 6-7 weeks followed by consolidation immunotherapy if she has no evidence for disease progression after the induction phase. I discussed with the patient the  adverse effect of the chemotherapy including but not limited to alopecia, myelosuppression, nausea and vomiting, peripheral neuropathy, liver or renal dysfunction. I will refer the patient to radiation oncology for evaluation and discussion of the radiotherapy option. The patient is still very reluctant about receiving treatment for her stage IIIb lung cancer and she would like to talk to her primary care physician first before making a decision. I gave the patient her prognosis with and without treatment.  She understands that without treatment her median overall survival is around 6 months and was treatment this could improve up to 2 and half years with a 5-year survival of around 42% with the concurrent chemoradiation followed by consolidation immunotherapy. The patient will call the office with her final decision if she is interested in proceeding with the treatment and I will be happy to place the care plan at that time.  Otherwise she will follow with her primary care physician and consider palliative care. The patient was advised to call immediately if she has any other concerning symptoms in the interval. The patient voices understanding of current disease status and treatment options and is in agreement with the current care plan.  All questions were answered. The patient knows to call the clinic with any problems, questions or concerns. We can certainly see the patient much sooner if necessary.  The total time spent in the appointment was 35 minutes.  Disclaimer: This note was dictated with voice recognition software. Similar sounding words can inadvertently be transcribed and may not be corrected upon review.

## 2021-03-08 ENCOUNTER — Encounter: Payer: Self-pay | Admitting: *Deleted

## 2021-03-08 ENCOUNTER — Telehealth: Payer: Self-pay | Admitting: Radiation Oncology

## 2021-03-08 NOTE — Telephone Encounter (Signed)
I called and left a voicemail for the patient to call us back to discuss chemoRT.

## 2021-03-08 NOTE — Progress Notes (Signed)
I followed up on Dr. Worthy Flank clinic note.  Per plan of care is concurrent chemo rad.  Referral to rad onc is completed and I updated rad onc team.

## 2021-03-09 ENCOUNTER — Other Ambulatory Visit: Payer: Self-pay | Admitting: *Deleted

## 2021-03-09 NOTE — Progress Notes (Signed)
The proposed treatment discussed in conference is for discussion purpose only and is not a binding recommendation. The patient was not physically examined, or presented with their treatment options. Therefore, final treatment plans cannot be decided.

## 2021-03-13 ENCOUNTER — Telehealth: Payer: Self-pay | Admitting: Emergency Medicine

## 2021-03-13 NOTE — Telephone Encounter (Signed)
Spoke with the pt  I rescheduled her appt with Dr Lamonte Sakai  Nothing further needed

## 2021-03-14 ENCOUNTER — Ambulatory Visit: Payer: Medicare Other | Admitting: Emergency Medicine

## 2021-03-14 ENCOUNTER — Telehealth: Payer: Self-pay | Admitting: *Deleted

## 2021-03-14 NOTE — Telephone Encounter (Signed)
I called Stacey Harmon to talk to her about her thoughts regarding treatment. I was unable to reach but did leave vm message with my name and phone number to call me back.

## 2021-03-15 ENCOUNTER — Ambulatory Visit (INDEPENDENT_AMBULATORY_CARE_PROVIDER_SITE_OTHER): Payer: Medicare Other

## 2021-03-15 ENCOUNTER — Encounter: Payer: Self-pay | Admitting: Emergency Medicine

## 2021-03-15 ENCOUNTER — Ambulatory Visit (INDEPENDENT_AMBULATORY_CARE_PROVIDER_SITE_OTHER): Payer: Medicare Other | Admitting: Emergency Medicine

## 2021-03-15 ENCOUNTER — Other Ambulatory Visit: Payer: Self-pay

## 2021-03-15 DIAGNOSIS — C3432 Malignant neoplasm of lower lobe, left bronchus or lung: Secondary | ICD-10-CM

## 2021-03-15 DIAGNOSIS — R0602 Shortness of breath: Secondary | ICD-10-CM

## 2021-03-15 DIAGNOSIS — R0600 Dyspnea, unspecified: Secondary | ICD-10-CM | POA: Insufficient documentation

## 2021-03-15 MED ORDER — SPIRIVA RESPIMAT 1.25 MCG/ACT IN AERS: 2.0000 | INHALATION_SPRAY | Freq: Every day | RESPIRATORY_TRACT | 0 refills | Status: AC

## 2021-03-15 MED ORDER — SPIRIVA RESPIMAT 1.25 MCG/ACT IN AERS: 2.0000 | INHALATION_SPRAY | Freq: Every day | RESPIRATORY_TRACT | 2 refills | Status: DC

## 2021-03-15 MED ORDER — ALBUTEROL SULFATE HFA 108 (90 BASE) MCG/ACT IN AERS: 2.0000 | INHALATION_SPRAY | RESPIRATORY_TRACT | 6 refills | Status: AC | PRN

## 2021-03-15 NOTE — Progress Notes (Signed)
Subjective:    Patient ID: Stacey Harmon, female    DOB: 1945/09/25, 75 y.o.   MRN: [id number]  HPI 75 year old former smoker (60 pack years) with a history of hypertension, cirrhosis (imuran, prednisone).  She suffered a fall on 01/05/2021 and went to the emergency department with rib cage pain.  Imaging revealed a left lower lobe mass on chest x-ray and then CT scan as below.  She is referred now for further evaluation abnormal CT. She is feeling fairly well currently, is having some cough. Prod of thin white mucous.   CT scan of the chest performed 01/05/2021 reviewed by me shows a 3.8 cm left lower lobe mass consistent with malignancy with associated left hilar and subcarinal lymphadenopathy.   ROV 02/07/21 --this is a follow-up visit for Stacey Harmon, former smoker as above with hypertension, cirrhosis.  She has a left lower lobe mass 3.8 cm with some associated left hilar and subcarinal lymphadenopathy.  She has had some significant hesitation regarding whether she would want treatment, chemo, XRT. Has discussed w me and w Dr Julien Nordmann.   MRI brain 01/27/2021 reviewed by me shows no evidence of intracranial metastatic disease  PET scan performed 01/27/2021 reviewed by me showed marked hypermetabolic activity in the left lower lobe mass with hyper metabolism in the left hilar lymphadenopathy, subcarinal node.  There is a 12 mm irregular left upper lobe pulmonary nodule that is also hypermetabolic, probably metastatic disease.  Hypermetabolism in the anterior fifth rib at the site of her rib fracture.    ROV 03/15/21 --75 year old woman with hypertension, cirrhosis.  We evaluated a left lower lobe mass with some associated left hilar and subcarinal lymphadenopathy.  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  She has seen Dr. Julien Nordmann and chemoradiation has been recommended.  She still try to decide whether she wants to pursue this -  it sounds like she is going to want to defer.  Today she reports that she kept some hemoptysis for about 3 days. She has continued to have SOB since the bronchoscopy - she is having more exertional SOB, has happened with shopping, with ADL's like showering. Recovers w rest. No wheeze.  Not on any BD currently. I discussed possible palliative care, hospice care with her today - may not be indicated yet.    Review of Systems As per HPI     Objective:   Physical Exam Vitals:   03/15/21 0951  BP: 118/64  Pulse: 81  Temp: 98.3 F (36.8 C)  TempSrc: Oral  SpO2: 94%  Weight: 134 lb 9.6 oz (61.1 kg)  Height: '5\' 3"'  (1.6 m)    Gen: Pleasant, well-nourished, in no distress,  normal affect  ENT: No lesions,  mouth clear,  oropharynx clear, no postnasal drip  Neck: No JVD, no stridor  Lungs: No use of accessory muscles, soft left-sided rhonchi, no wheeze or crackles  Cardiovascular: RRR, heart sounds normal, no murmur or gallops, no peripheral edema  Musculoskeletal: No deformities, no cyanosis or clubbing  Neuro: alert, awake, non focal  Skin: Warm, no lesions or rash      Assessment & Plan:  Dyspnea More shortness of since I last saw her for her bronchoscopy.  Unclear whether this reflects impact of her malignancy, presumed COPD (no PFTs), other causes such as effusion, etc.  She needs a chest x-ray now.  I think it would be reasonable for an empiric  trial of Spiriva to see if she gets benefit.  We will also teach her how to use albuterol.  Follow-up to assess her response to bronchodilator therapy  Malignant neoplasm of lower lobe of left lung [facility]) She has met with Dr. Julien Nordmann but remains unsure as to whether she would want any treatment.  Chemoradiation was recommended.  If she decides to defer then I think we will need to monitor symptoms closely, determine when she would possibly benefit from palliative care, hospice care.  I will try to coordinate this with the patient and also  with her PCP Dr. Ayesha Rumpf.   Baltazar Apo, MD, PhD 03/15/2021, 10:10 AM [facility] [phone] or if no answer before 7:00PM call [phone] For any issues after 7:00PM please call eLink [phone]

## 2021-03-15 NOTE — Assessment & Plan Note (Addendum)
More shortness of since I last saw her for her bronchoscopy.  Unclear whether this reflects impact of her malignancy, presumed COPD (no PFTs), other causes such as effusion, etc.  She needs a chest x-ray now.  I think it would be reasonable for an empiric trial of Spiriva to see if she gets benefit.  We will also teach her how to use albuterol.  Follow-up to assess her response to bronchodilator therapy

## 2021-03-15 NOTE — Patient Instructions (Signed)
We talked today about her diagnosis of lung cancer.  I am glad that you are discussing possible treatment with Dr. Julien Nordmann.  If we decide not to pursue treatment then there may be benefit at some point in the future in referral for palliative care or hospice input.  We can discuss this further going forward. Please try Spiriva Respimat, 2 puffs once daily.  Take this medication reliably every day and keep track of whether it helps your breathing.  If so we can send a prescription for it to your pharmacy. We will give your prescription for albuterol.  You can use 2 puffs up to every 4 hours if needed for shortness of breath, chest tightness, wheezing. Chest x-ray today Follow with Dr Lamonte Sakai in 3 months or sooner if you have any problems.

## 2021-03-15 NOTE — Assessment & Plan Note (Signed)
She has met with Dr. Julien Nordmann but remains unsure as to whether she would want any treatment.  Chemoradiation was recommended.  If she decides to defer then I think we will need to monitor symptoms closely, determine when she would possibly benefit from palliative care, hospice care.  I will try to coordinate this with the patient and also with her PCP Dr. Ayesha Rumpf.

## 2021-03-16 ENCOUNTER — Ambulatory Visit: Payer: Medicare Other

## 2021-03-16 ENCOUNTER — Telehealth: Payer: Self-pay | Admitting: *Deleted

## 2021-03-16 ENCOUNTER — Ambulatory Visit: Payer: Medicare Other | Admitting: Radiation Oncology

## 2021-03-16 NOTE — Telephone Encounter (Signed)
I received a call from Stacey Harmon today. She does not want to be seen with rad onc today. I cancelled the appt and notified rad onc team. She explained that she is not ready to make a decision about treatment at this time.  She would like me to call her back in 3 weeks. Will call her back.

## 2021-04-10 ENCOUNTER — Telehealth: Payer: Self-pay | Admitting: *Deleted

## 2021-04-10 NOTE — Telephone Encounter (Signed)
I called to check on Stacey Harmon and see if she made her mind up about her mind regarding treatment.  She states she has not made up her mind about treatment. She is leaning more not getting treatment.  I advised her to talk to PCP to get her set up palliative and hospice care.  At the end of the conversation she states she is still not decided about treatment.  I asked that she call the cancer center for assistance if needed.

## 2021-04-13 ENCOUNTER — Other Ambulatory Visit: Payer: Self-pay

## 2021-04-13 ENCOUNTER — Ambulatory Visit: Payer: Medicare Other | Admitting: Emergency Medicine

## 2021-04-13 ENCOUNTER — Encounter: Payer: Self-pay | Admitting: Emergency Medicine

## 2021-04-13 DIAGNOSIS — C3432 Malignant neoplasm of lower lobe, left bronchus or lung: Secondary | ICD-10-CM | POA: Diagnosis not present

## 2021-04-13 DIAGNOSIS — R0602 Shortness of breath: Secondary | ICD-10-CM

## 2021-04-13 NOTE — Assessment & Plan Note (Signed)
Discussed with her again the pros and cons of treatment for her newly diagnosed squamous cell lung cancer.  She remains unsure about whether she would want therapy.  I have asked her to contact me if she develops symptoms.  I have also asked her to contact me if she wants to talk to oncology, revisit the options.

## 2021-04-13 NOTE — Patient Instructions (Addendum)
We will plan to continue Spiriva Respimat 2 puffs once daily.  Take this every day on a schedule. Keep albuterol available so you can use 2 puffs if you need it for shortness of breath, chest tightness, wheezing. Please call and let us know if you would like to be referred back to oncology to talk about possible treatment options for lung cancer. Follow with Dr Lamonte Sakai in 6 months or sooner if you have any problems

## 2021-04-13 NOTE — Progress Notes (Signed)
   Subjective:    Patient ID: Stacey Harmon, female    DOB: August 11, 1945, 75 y.o.   MRN: [id number]  HPI  ROV 03/15/21 --75 year old woman with hypertension, cirrhosis.  We evaluated a left lower lobe mass with some associated left hilar and subcarinal lymphadenopathy.  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  She has seen Dr. Julien Nordmann and chemoradiation has been recommended.  She still try to decide whether she wants to pursue this - it sounds like she is going to want to defer.  Today she reports that she kept some hemoptysis for about 3 days. She has continued to have SOB since the bronchoscopy - she is having more exertional SOB, has happened with shopping, with ADL's like showering. Recovers w rest. No wheeze.  Not on any BD currently. I discussed possible palliative care, hospice care with her today - may not be indicated yet.   ROV 04/13/21 --follow-up for 75 year old woman with newly diagnosed squamous cell lung cancer.  Has seen  Dr. Julien Nordmann and radiation oncology but has not chosen to start any treatment.  She has presumed COPD (no PFTs) so I started Spiriva to see if she would get benefit.  Today she reports that she felt some benefit from the Spiriva, although she wasn't sure that she had good technique. She is unsure whether she wants to do any cancer treatment - we discussed further today, including possible hospice care at some point.    Review of Systems As per HPI     Objective:   Physical Exam Vitals:   04/13/21 1540  BP: 112/60  Pulse: 85  SpO2: 94%  Weight: 139 lb (63 kg)  Height: 5\' 3"  (1.6 m)    Gen: Pleasant, well-nourished, in no distress,  normal affect  ENT: No lesions,  mouth clear,  oropharynx clear, no postnasal drip  Neck: No JVD, no stridor  Lungs: No use of accessory muscles, soft left-sided rhonchi, no wheeze or crackles  Cardiovascular: RRR, heart sounds normal, no murmur or  gallops, no peripheral edema  Musculoskeletal: No deformities, no cyanosis or clubbing  Neuro: alert, awake, non focal  Skin: Warm, no lesions or rash      Assessment & Plan:  Dyspnea With presumed COPD.  She did get some clinical benefit from Spiriva Respimat.  She needs some assistance and some coaching on using it correctly.  We will also work on coaching her on albuterol.  We may decide to get pulmonary function testing at some point in the future although she would like to avoid any extra testing if possible.  Malignant neoplasm of lower lobe of left lung [facility]) Discussed with her again the pros and cons of treatment for her newly diagnosed squamous cell lung cancer.  She remains unsure about whether she would want therapy.  I have asked her to contact me if she develops symptoms.  I have also asked her to contact me if she wants to talk to oncology, revisit the options.   Baltazar Apo, MD, PhD 04/13/2021, 4:07 PM [facility] [phone] or if no answer before 7:00PM call [phone] For any issues after 7:00PM please call eLink [phone]

## 2021-04-13 NOTE — Assessment & Plan Note (Signed)
With presumed COPD.  She did get some clinical benefit from Spiriva Respimat.  She needs some assistance and some coaching on using it correctly.  We will also work on coaching her on albuterol.  We may decide to get pulmonary function testing at some point in the future although she would like to avoid any extra testing if possible.

## 2021-04-14 ENCOUNTER — Ambulatory Visit: Payer: Medicare Other | Admitting: Emergency Medicine

## 2021-06-08 ENCOUNTER — Other Ambulatory Visit: Payer: Self-pay | Admitting: Emergency Medicine

## 2021-06-12 ENCOUNTER — Encounter (HOSPITAL_COMMUNITY): Payer: Self-pay

## 2021-06-22 ENCOUNTER — Emergency Department (HOSPITAL_COMMUNITY): Payer: Medicare Other

## 2021-06-22 ENCOUNTER — Encounter (HOSPITAL_COMMUNITY): Payer: Self-pay | Admitting: Emergency Medicine

## 2021-06-22 ENCOUNTER — Inpatient Hospital Stay (HOSPITAL_COMMUNITY)
Admission: EM | Admit: 2021-06-22 | Discharge: 2021-07-05 | DRG: 951 | Disposition: E | Payer: Medicare Other | Attending: Internal Medicine | Admitting: Internal Medicine

## 2021-06-22 ENCOUNTER — Other Ambulatory Visit: Payer: Self-pay

## 2021-06-22 DIAGNOSIS — L89152 Pressure ulcer of sacral region, stage 2: Secondary | ICD-10-CM | POA: Diagnosis present

## 2021-06-22 DIAGNOSIS — Z66 Do not resuscitate: Secondary | ICD-10-CM | POA: Diagnosis present

## 2021-06-22 DIAGNOSIS — R590 Localized enlarged lymph nodes: Secondary | ICD-10-CM | POA: Diagnosis present

## 2021-06-22 DIAGNOSIS — Z9221 Personal history of antineoplastic chemotherapy: Secondary | ICD-10-CM

## 2021-06-22 DIAGNOSIS — J9621 Acute and chronic respiratory failure with hypoxia: Secondary | ICD-10-CM | POA: Diagnosis present

## 2021-06-22 DIAGNOSIS — L8990 Pressure ulcer of unspecified site, unspecified stage: Secondary | ICD-10-CM | POA: Insufficient documentation

## 2021-06-22 DIAGNOSIS — D649 Anemia, unspecified: Secondary | ICD-10-CM | POA: Diagnosis present

## 2021-06-22 DIAGNOSIS — C3402 Malignant neoplasm of left main bronchus: Secondary | ICD-10-CM | POA: Diagnosis present

## 2021-06-22 DIAGNOSIS — Z87891 Personal history of nicotine dependence: Secondary | ICD-10-CM

## 2021-06-22 DIAGNOSIS — G9341 Metabolic encephalopathy: Secondary | ICD-10-CM | POA: Diagnosis present

## 2021-06-22 DIAGNOSIS — J90 Pleural effusion, not elsewhere classified: Secondary | ICD-10-CM

## 2021-06-22 DIAGNOSIS — I4891 Unspecified atrial fibrillation: Secondary | ICD-10-CM

## 2021-06-22 DIAGNOSIS — D75839 Thrombocytosis, unspecified: Secondary | ICD-10-CM | POA: Diagnosis present

## 2021-06-22 DIAGNOSIS — Z6824 Body mass index (BMI) 24.0-24.9, adult: Secondary | ICD-10-CM | POA: Diagnosis not present

## 2021-06-22 DIAGNOSIS — R64 Cachexia: Secondary | ICD-10-CM | POA: Diagnosis present

## 2021-06-22 DIAGNOSIS — E43 Unspecified severe protein-calorie malnutrition: Secondary | ICD-10-CM | POA: Diagnosis present

## 2021-06-22 DIAGNOSIS — Z515 Encounter for palliative care: Principal | ICD-10-CM

## 2021-06-22 DIAGNOSIS — J189 Pneumonia, unspecified organism: Secondary | ICD-10-CM | POA: Diagnosis present

## 2021-06-22 DIAGNOSIS — K743 Primary biliary cirrhosis: Secondary | ICD-10-CM | POA: Diagnosis present

## 2021-06-22 DIAGNOSIS — I10 Essential (primary) hypertension: Secondary | ICD-10-CM | POA: Diagnosis present

## 2021-06-22 DIAGNOSIS — C3492 Malignant neoplasm of unspecified part of left bronchus or lung: Secondary | ICD-10-CM | POA: Diagnosis not present

## 2021-06-22 DIAGNOSIS — R54 Age-related physical debility: Secondary | ICD-10-CM | POA: Diagnosis present

## 2021-06-22 DIAGNOSIS — J188 Other pneumonia, unspecified organism: Secondary | ICD-10-CM | POA: Diagnosis present

## 2021-06-22 DIAGNOSIS — Z20822 Contact with and (suspected) exposure to covid-19: Secondary | ICD-10-CM | POA: Diagnosis present

## 2021-06-22 DIAGNOSIS — J9601 Acute respiratory failure with hypoxia: Secondary | ICD-10-CM

## 2021-06-22 DIAGNOSIS — Z79899 Other long term (current) drug therapy: Secondary | ICD-10-CM

## 2021-06-22 DIAGNOSIS — R627 Adult failure to thrive: Secondary | ICD-10-CM | POA: Diagnosis present

## 2021-06-22 DIAGNOSIS — J9811 Atelectasis: Secondary | ICD-10-CM | POA: Diagnosis present

## 2021-06-22 DIAGNOSIS — E860 Dehydration: Secondary | ICD-10-CM | POA: Diagnosis present

## 2021-06-22 DIAGNOSIS — Z880 Allergy status to penicillin: Secondary | ICD-10-CM

## 2021-06-22 DIAGNOSIS — Z7952 Long term (current) use of systemic steroids: Secondary | ICD-10-CM

## 2021-06-22 LAB — RESP PANEL BY RT-PCR (FLU A&B, COVID) ARPGX2
Influenza A by PCR: NEGATIVE
Influenza B by PCR: NEGATIVE
SARS Coronavirus 2 by RT PCR: NEGATIVE

## 2021-06-22 LAB — COMPREHENSIVE METABOLIC PANEL
ALT: 14 U/L (ref 0–44)
AST: 23 U/L (ref 15–41)
Albumin: 2.5 g/dL — ABNORMAL LOW (ref 3.5–5.0)
Alkaline Phosphatase: 69 U/L (ref 38–126)
Anion gap: 10 (ref 5–15)
BUN: 40 mg/dL — ABNORMAL HIGH (ref 8–23)
CO2: 27 mmol/L (ref 22–32)
Calcium: 13 mg/dL — ABNORMAL HIGH (ref 8.9–10.3)
Chloride: 98 mmol/L (ref 98–111)
Creatinine, Ser: 0.76 mg/dL (ref 0.44–1.00)
GFR, Estimated: >60 mL/min (ref >60)
Glucose, Bld: 103 mg/dL — ABNORMAL HIGH (ref 70–99)
Potassium: 3.8 mmol/L (ref 3.5–5.1)
Sodium: 135 mmol/L (ref 135–145)
Total Bilirubin: 1.4 mg/dL — ABNORMAL HIGH (ref 0.3–1.2)
Total Protein: 6.4 g/dL — ABNORMAL LOW (ref 6.5–8.1)

## 2021-06-22 LAB — CBC WITH DIFFERENTIAL/PLATELET
Abs Immature Granulocytes: 0.19 10*3/uL — ABNORMAL HIGH (ref 0.00–0.07)
Basophils Absolute: 0 10*3/uL (ref 0.0–0.1)
Basophils Relative: 0 %
Eosinophils Absolute: 0 10*3/uL (ref 0.0–0.5)
Eosinophils Relative: 0 %
HCT: 35.6 % — ABNORMAL LOW (ref 36.0–46.0)
Hemoglobin: 11.9 g/dL — ABNORMAL LOW (ref 12.0–15.0)
Immature Granulocytes: 1 %
Lymphocytes Relative: 3 %
Lymphs Abs: 0.6 10*3/uL — ABNORMAL LOW (ref 0.7–4.0)
MCH: 32.4 pg (ref 26.0–34.0)
MCHC: 33.4 g/dL (ref 30.0–36.0)
MCV: 97 fL (ref 80.0–100.0)
Monocytes Absolute: 1.5 10*3/uL — ABNORMAL HIGH (ref 0.1–1.0)
Monocytes Relative: 7 %
Neutro Abs: 17.3 10*3/uL — ABNORMAL HIGH (ref 1.7–7.7)
Neutrophils Relative %: 89 %
Platelets: 464 10*3/uL — ABNORMAL HIGH (ref 150–400)
RBC: 3.67 MIL/uL — ABNORMAL LOW (ref 3.87–5.11)
RDW: 15.6 % — ABNORMAL HIGH (ref 11.5–15.5)
WBC: 19.6 10*3/uL — ABNORMAL HIGH (ref 4.0–10.5)
nRBC: 0 % (ref 0.0–0.2)

## 2021-06-22 LAB — URINALYSIS, ROUTINE W REFLEX MICROSCOPIC
Bilirubin Urine: NEGATIVE
Glucose, UA: NEGATIVE mg/dL
Ketones, ur: 5 mg/dL — AB
Nitrite: NEGATIVE
Protein, ur: NEGATIVE mg/dL
Specific Gravity, Urine: 1.017 (ref 1.005–1.030)
pH: 5 (ref 5.0–8.0)

## 2021-06-22 LAB — AMMONIA: Ammonia: 13 umol/L (ref 9–35)

## 2021-06-22 LAB — CBG MONITORING, ED: Glucose-Capillary: 107 mg/dL — ABNORMAL HIGH (ref 70–99)

## 2021-06-22 LAB — CK: Total CK: 57 U/L (ref 38–234)

## 2021-06-22 MED ORDER — ENSURE ENLIVE PO LIQD
237.0000 mL | Freq: Two times a day (BID) | ORAL | Status: DC
  Administered 2021-06-23 – 2021-06-24 (×3): 237 mL via ORAL

## 2021-06-22 MED ORDER — MORPHINE SULFATE (PF) 2 MG/ML IV SOLN
1.0000 mg | INTRAVENOUS | Status: DC | PRN
  Administered 2021-06-23 – 2021-06-25 (×3): 1 mg via INTRAVENOUS
  Filled 2021-06-22 (×4): qty 1

## 2021-06-22 MED ORDER — HALOPERIDOL LACTATE 5 MG/ML IJ SOLN: 0.5000 mg | INTRAMUSCULAR | Status: DC | PRN

## 2021-06-22 MED ORDER — DILTIAZEM HCL-DEXTROSE 125-5 MG/125ML-% IV SOLN (PREMIX): 5.0000 mg/h | INTRAVENOUS | Status: DC

## 2021-06-22 MED ORDER — ACETAMINOPHEN 325 MG PO TABS: 650.0000 mg | ORAL_TABLET | Freq: Four times a day (QID) | ORAL | Status: DC | PRN

## 2021-06-22 MED ORDER — SODIUM CHLORIDE 0.9 % IV BOLUS
500.0000 mL | Freq: Once | INTRAVENOUS | Status: AC
  Administered 2021-06-22: 500 mL via INTRAVENOUS

## 2021-06-22 MED ORDER — ONDANSETRON HCL 4 MG/2ML IJ SOLN: 4.0000 mg | Freq: Four times a day (QID) | INTRAMUSCULAR | Status: DC | PRN

## 2021-06-22 MED ORDER — IOHEXOL 350 MG/ML SOLN
100.0000 mL | Freq: Once | INTRAVENOUS | Status: AC | PRN
  Administered 2021-06-22: 61 mL via INTRAVENOUS

## 2021-06-22 MED ORDER — LEVOFLOXACIN IN D5W 750 MG/150ML IV SOLN
750.0000 mg | Freq: Once | INTRAVENOUS | Status: DC
Start: 2021-06-22 — End: 2021-06-22
  Administered 2021-06-22: 750 mg via INTRAVENOUS
  Filled 2021-06-22: qty 150

## 2021-06-22 MED ORDER — GLYCOPYRROLATE 0.2 MG/ML IJ SOLN
0.2000 mg | INTRAMUSCULAR | Status: DC | PRN
  Filled 2021-06-22: qty 1

## 2021-06-22 MED ORDER — HALOPERIDOL 0.5 MG PO TABS
0.5000 mg | ORAL_TABLET | ORAL | Status: DC | PRN
  Filled 2021-06-22: qty 1

## 2021-06-22 MED ORDER — BIOTENE DRY MOUTH MT LIQD: 15.0000 mL | OROMUCOSAL | Status: DC | PRN

## 2021-06-22 MED ORDER — SODIUM CHLORIDE (PF) 0.9 % IJ SOLN
INTRAMUSCULAR | Status: AC
  Filled 2021-06-22: qty 50

## 2021-06-22 MED ORDER — HALOPERIDOL LACTATE 2 MG/ML PO CONC: 0.5000 mg | ORAL | Status: DC | PRN

## 2021-06-22 MED ORDER — MORPHINE SULFATE (CONCENTRATE) 10 MG/0.5ML PO SOLN: 5.0000 mg | ORAL | Status: DC | PRN

## 2021-06-22 MED ORDER — ONDANSETRON 4 MG PO TBDP: 4.0000 mg | ORAL_TABLET | Freq: Four times a day (QID) | ORAL | Status: DC | PRN

## 2021-06-22 MED ORDER — POLYVINYL ALCOHOL 1.4 % OP SOLN: 1.0000 [drp] | Freq: Four times a day (QID) | OPHTHALMIC | Status: DC | PRN

## 2021-06-22 MED ORDER — LORAZEPAM 2 MG/ML PO CONC: 1.0000 mg | ORAL | Status: DC | PRN

## 2021-06-22 MED ORDER — DILTIAZEM LOAD VIA INFUSION
10.0000 mg | Freq: Once | INTRAVENOUS | Status: DC
  Filled 2021-06-22: qty 10

## 2021-06-22 MED ORDER — LORAZEPAM 1 MG PO TABS: 1.0000 mg | ORAL_TABLET | ORAL | Status: DC | PRN

## 2021-06-22 MED ORDER — LACTATED RINGERS IV SOLN: INTRAVENOUS | Status: AC

## 2021-06-22 MED ORDER — MORPHINE SULFATE (CONCENTRATE) 10 MG/0.5ML PO SOLN
5.0000 mg | ORAL | Status: DC | PRN
  Administered 2021-06-24: 5 mg via ORAL
  Filled 2021-06-22: qty 0.5

## 2021-06-22 MED ORDER — ACETAMINOPHEN 650 MG RE SUPP: 650.0000 mg | Freq: Four times a day (QID) | RECTAL | Status: DC | PRN

## 2021-06-22 MED ORDER — GLYCOPYRROLATE 1 MG PO TABS: 1.0000 mg | ORAL_TABLET | ORAL | Status: DC | PRN

## 2021-06-22 MED ORDER — LORAZEPAM 2 MG/ML IJ SOLN
1.0000 mg | INTRAMUSCULAR | Status: DC | PRN
  Administered 2021-06-24: 1 mg via INTRAVENOUS
  Filled 2021-06-22: qty 1

## 2021-06-22 NOTE — ED Notes (Signed)
Patient transported to CT 

## 2021-06-22 NOTE — Consult Note (Signed)
NAME:  Stacey Harmon, MRN:  [id number], DOB:  July 07, 1945, LOS: 0 ADMISSION DATE:  06/23/2021, CONSULTATION DATE:  06/29/2021 REFERRING MD:  Marylyn Ishihara, CHIEF COMPLAINT:  FTT   History of Present Illness:  76 year old woman w/ hx of HTN and cirrhosis, squamous cell lung cancer diagnosed this year presenting with progressive failure to thrive, confusion, weight loss, anorexia.  Has dry cough, denies dyspnea.  Noted to be hypoxemic in ER.  Has not told son or niece about diagnosis until recently.  Has met with Dr. Julien Nordmann in Oct 2023 and was not sure she wanted therapy at that time.  CT imaging showing rapidly progressive enlargement of tumor now occluding left mainstem with mediastinal adenopathy.    Pertinent  Medical History  Cirrhosis Former smoker  Significant Hospital Events: Including procedures, antibiotic start and stop dates in addition to other pertinent events   06/15/2021 admitted  Interim History / Subjective:  consulted  Objective   Blood pressure 126/76, pulse 97, temperature 97.7 F (36.5 C), temperature source Oral, resp. rate (!) 26, SpO2 96 %.        Intake/Output Summary (Last 24 hours) at 06/30/2021 1534 Last data filed at 06/14/2021 1529 Gross per 24 hour  Intake 93.24 ml  Output --  Net 93.24 ml   There were no vitals filed for this visit.  Examination: General: frail woman lying in bed HENT: temporal wasting MMM, trachea midline Lungs: absent breath sounds on left, R clear Cardiovascular: RRR, ext warm Abdomen: protuberant, hypoactive BS Extremities: muscle wasting Neuro: moves all 4 ext to command Psych: poor insight  Leukocytosis/thrombocytosis noted CT personally reviewed  Resolved Hospital Problem list   N/a  Assessment & Plan:  Progressive now stage 3b/4 squamous cell lung cancer with FTT, anorexia, confusion.  Reviewed imaging, discussed options of pursuing aggressive treatment which patient has not wanted in past vs. Focusing on comfort and  she/son/niece would like for her to be comfortable in her final days/weeks possibly months.  Thoracentesis unlikely to be symptomatically beneficial in setting of occlusive left mainstem tumor. - Admit and arrange for home hospice - Currently no complaints - Consider palliative consult - PCCM available PRN - Will let Dr. Lamonte Sakai know of current developments  Best Practice (right click and "Reselect all SmartList Selections" daily)  Per primary  Labs   CBC: Recent Labs  Lab 06/30/2021 1211  WBC 19.6*  NEUTROABS 17.3*  HGB 11.9*  HCT 35.6*  MCV 97.0  PLT 464*    Basic Metabolic Panel: Recent Labs  Lab 06/21/2021 1151  NA 135  K 3.8  CL 98  CO2 27  GLUCOSE 103*  BUN 40*  CREATININE 0.76  CALCIUM 13.0*   GFR: CrCl cannot be calculated (Unknown ideal weight.). Recent Labs  Lab 06/28/2021 1211  WBC 19.6*    Liver Function Tests: Recent Labs  Lab 06/09/2021 1151  AST 23  ALT 14  ALKPHOS 69  BILITOT 1.4*  PROT 6.4*  ALBUMIN 2.5*   No results for input(s): LIPASE, AMYLASE in the last 168 hours. Recent Labs  Lab 07/03/2021 1249  AMMONIA 13    ABG No results found for: PHART, PCO2ART, PO2ART, HCO3, TCO2, ACIDBASEDEF, O2SAT   Coagulation Profile: No results for input(s): INR, PROTIME in the last 168 hours.  Cardiac Enzymes: Recent Labs  Lab 06/15/2021 1211  CKTOTAL 57    HbA1C: No results found for: HGBA1C  CBG: Recent Labs  Lab 06/20/2021 1156  GLUCAP 107*    Review  of Systems:    Positive Symptoms in bold:  Constitutional fevers, chills, weight loss, fatigue, anorexia, malaise  Eyes decreased vision, double vision, eye irritation  Ears, Nose, Mouth, Throat sore throat, trouble swallowing, sinus congestion  Cardiovascular chest pain, paroxysmal nocturnal dyspnea, lower ext edema, palpitations   Respiratory SOB, cough, DOE, hemoptysis, wheezing  Gastrointestinal nausea, vomiting, diarrhea  Genitourinary burning with urination, trouble urinating   Musculoskeletal joint aches, joint swelling, back pain  Integumentary  rashes, skin lesions  Neurological focal weakness, focal numbness, trouble speaking, headaches  Psychiatric depression, anxiety, confusion  Endocrine polyuria, polydipsia, cold intolerance, heat intolerance  Hematologic abnormal bruising, abnormal bleeding, unexplained nose bleeds  Allergic/Immunologic recurrent infections, hives, swollen lymph nodes     Past Medical History:  She,  has a past medical history of Hypertension and 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[place]).   Surgical History:   Past Surgical History:  Procedure Laterality Date   BLADDER SURGERY     BRONCHIAL BIOPSY  02/27/2021   Procedure: BRONCHIAL BIOPSIES;  Surgeon: Collene Gobble, MD;  Location: [facility] ENDOSCOPY;  Service: Pulmonary;;   BRONCHIAL BRUSHINGS  02/27/2021   Procedure: BRONCHIAL BRUSHINGS;  Surgeon: Collene Gobble, MD;  Location[facility] ENDOSCOPY;  Service: Pulmonary;;   BRONCHIAL NEEDLE ASPIRATION BIOPSY  02/27/2021   Procedure: BRONCHIAL NEEDLE ASPIRATION BIOPSIES;  Surgeon: Collene Gobble, MD;  Location: [facility] ENDOSCOPY;  Service: Pulmonary;;   COLONOSCOPY     VIDEO BRONCHOSCOPY WITH ENDOBRONCHIAL NAVIGATION N/A 02/27/2021   Procedure: ROBOTIC [place];  Surgeon: Collene Gobble, MD;  Location: [facility] ENDOSCOPY;  Service: Pulmonary;  Laterality: N/A;   VIDEO BRONCHOSCOPY WITH ENDOBRONCHIAL ULTRASOUND N/A 02/27/2021   Procedure: VIDEO BRONCHOSCOPY WITH ENDOBRONCHIAL ULTRASOUND;  Surgeon: Collene Gobble, MD;  Location[facility] ENDOSCOPY;  Service: Pulmonary;  Laterality: N/A;   VIDEO BRONCHOSCOPY WITH RADIAL ENDOBRONCHIAL ULTRASOUND  02/27/2021   Procedure: VIDEO BRONCHOSCOPY WITH RADIAL ENDOBRONCHIAL ULTRASOUND;  Surgeon: Collene Gobble, MD;  Location: [facility] ENDOSCOPY;  Service: Pulmonary;;     Social History:   reports that she has quit smoking. Her smoking use included cigarettes. She has a 17.50 pack-year smoking history. She has  quit using smokeless tobacco. She reports that she does not drink alcohol and does not use drugs.   Family History:  Her family history is not on file.   Allergies Allergies  Allergen Reactions   Penicillins Swelling    Reaction: 19 years     Home Medications  Prior to Admission medications   Medication Sig Start Date End Date Taking? Authorizing Provider  albuterol (VENTOLIN HFA) 108 (90 Base) MCG/ACT inhaler Inhale 2 puffs into the lungs every 4 (four) hours as needed for wheezing or shortness of breath. 03/15/21   Collene Gobble, MD  ALPRAZolam Duanne Moron) 0.5 MG tablet Take 0.25 mg by mouth daily. 07/08/18   [provider]  amLODipine (NORVASC) 10 MG tablet Take 10 mg by mouth daily.      Lin Landsman, MD  azaTHIOprine (IMURAN) 50 MG tablet Take 125 mg by mouth daily.     Carol Ada, MD  bimatoprost (LUMIGAN) 0.03 % ophthalmic solution Place 1 drop into both eyes at bedtime.    [provider]  brimonidine-timolol (COMBIGAN) 0.2-0.5 % ophthalmic solution Place 1 drop into both eyes every 12 (twelve) hours.    [provider]  Multiple Vitamins-Calcium (ONE-A-DAY WOMENS PO) Take 1 tablet by mouth daily.      [provider]  NYSTATIN powder Apply 1 application  topically daily as needed (sweat under breast). 11/23/19   [provider]  Omega-3 Fatty Acids (FISH OIL) 1000 MG CAPS Take 1,000 mg by mouth daily.    Lin Landsman, MD  Polyvinyl Alcohol (LUBRICANT DROPS OP) Place 1 drop into both eyes daily as needed (Dry eyes).    [provider]  predniSONE (DELTASONE) 5 MG tablet Take 5 mg by mouth daily.    Carol Ada, MD  Promethazine HCl 6.25 MG/5ML SOLN Take 5 mLs by mouth every 4 (four) hours as needed for cough. 02/09/21   [provider]  SPIRIVA RESPIMAT 1.25 MCG/ACT AERS INHALE 2 PUFFS INTO THE LUNGS DAILY 06/08/21   Collene Gobble, MD  Tiotropium Bromide Monohydrate (SPIRIVA RESPIMAT) 1.25 MCG/ACT AERS Inhale 2 puffs  into the lungs daily. 03/15/21   Collene Gobble, MD  triamterene-hydrochlorothiazide (MAXZIDE-25) 37.5-25 MG per tablet Take 1 tablet by mouth daily.    [provider]
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
## 2021-06-22 NOTE — ED Notes (Signed)
Dinner tray ordered for the patient.

## 2021-06-22 NOTE — Progress Notes (Signed)
A consult was received from an ED physician for Stacey Harmon per pharmacy dosing.  [name] profile has been reviewed for ht/wt/allergies/indication/available labs.   A one time order has been placed for levaquin per MD.  Further antibiotics/pharmacy consults should be ordered by admitting physician if indicated.                       Spoke to patient and family member regarding allergies. Family member does not know. Patient reports penicillin allergy that caused facial swelling. Patient does not recall ever tolerating a cephalosporin. Checked with patient's pharmacy and they have a history of filling azithromycin for her but no beta-lactams.   Thank you, Stacey Harmon Form 06/29/2021  2:03 PM

## 2021-06-22 NOTE — H&P (Signed)
History and Physical    Stacey Harmon HCW:[id number] DOB: 02-Apr-1946 DOA: 06/10/2021  PCP: Lin Landsman, MD  Patient coming from: Home  Chief Complaint: weakness  HPI: Stacey Harmon is a 76 y.o. female with medical history significant of HTN, cirrhosis of the liver, left lung neoplasm. Presenting with weakness. History is from family, patient and chart review as the patient is somewhat confused. Per family, she generally hasn't been herself for the past several week. They did not know that she carried a diagnosis of left lung CA as she has been actively hiding that from them. They tried to contact her this morning, but she was not answering the phone. They became concerned and went to her house. They found her down on the floor, so they called for EMS. The patient reports that she was lying on the couch and heard knocking at the door. She is unable to tell us what happened after. She lives by herself. She reports 3 days of weakness. She apparently has not been keeping up her PO intake for some time, but can not specify how long. She was brought to the ED for evaluation.   ED Course: She was noted to have a large pleural effusion on XR and possible PNA. Follow up CTA showed complete collapse of the left lung and progressing neoplastic disease. PCCM was consulted. After discussion with family/patient, they decided to persue comfort care measures. [facility] was called for admission.   Review of Systems:  Denies CP, palpitations, N/V/D, fevers, sick contacts. Review of systems is otherwise negative for all not mentioned in HPI.   PMHx Past Medical History:  Diagnosis Date   Hypertension    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[place])     PSHx Past Surgical History:  Procedure Laterality Date   BLADDER SURGERY     BRONCHIAL BIOPSY  02/27/2021   Procedure: BRONCHIAL BIOPSIES;  Surgeon: Collene Gobble, MD;  Location: [facility] ENDOSCOPY;  Service: Pulmonary;;   BRONCHIAL BRUSHINGS  02/27/2021   Procedure: BRONCHIAL BRUSHINGS;   Surgeon: Collene Gobble, MD;  Location: [facility] ENDOSCOPY;  Service: Pulmonary;;   BRONCHIAL NEEDLE ASPIRATION BIOPSY  02/27/2021   Procedure: BRONCHIAL NEEDLE ASPIRATION BIOPSIES;  Surgeon: Collene Gobble, MD;  Location: [facility] ENDOSCOPY;  Service: Pulmonary;;   COLONOSCOPY     VIDEO BRONCHOSCOPY WITH ENDOBRONCHIAL NAVIGATION N/A 02/27/2021   Procedure: ROBOTIC VIDEO BRONCHOSCOPY WITH ENDOBRONCHIAL NAVIGATION;  Surgeon: Collene Gobble, MD;  Location: [facility] ENDOSCOPY;  Service: Pulmonary;  Laterality: N/A;   VIDEO BRONCHOSCOPY WITH ENDOBRONCHIAL ULTRASOUND N/A 02/27/2021   Procedure: VIDEO BRONCHOSCOPY WITH ENDOBRONCHIAL ULTRASOUND;  Surgeon: Collene Gobble, MD;  Location: [facility] ENDOSCOPY;  Service: Pulmonary;  Laterality: N/A;   VIDEO BRONCHOSCOPY WITH RADIAL ENDOBRONCHIAL ULTRASOUND  02/27/2021   Procedure: VIDEO BRONCHOSCOPY WITH RADIAL ENDOBRONCHIAL ULTRASOUND;  Surgeon: Collene Gobble, MD;  Location: [facility] ENDOSCOPY;  Service: Pulmonary;;    SocHx  reports that she has quit smoking. Her smoking use included cigarettes. She has a 17.50 pack-year smoking history. She has quit using smokeless tobacco. She reports that she does not drink alcohol and does not use drugs.  Allergies  Allergen Reactions   Penicillins Swelling    Reaction: 19 years    FamHx History reviewed. No pertinent family history.  Prior to Admission medications   Medication Sig Start Date End Date Taking? Authorizing Provider  albuterol (VENTOLIN HFA) 108 (90 Base) MCG/ACT inhaler Inhale 2 puffs into the lungs every 4 (four) hours as needed for wheezing or shortness of  breath. 03/15/21   Collene Gobble, MD  ALPRAZolam Duanne Moron) 0.5 MG tablet Take 0.25 mg by mouth daily. 07/08/18   [provider]  amLODipine (NORVASC) 10 MG tablet Take 10 mg by mouth daily.      Lin Landsman, MD  azaTHIOprine (IMURAN) 50 MG tablet Take 125 mg by mouth daily.     Carol Ada, MD  bimatoprost (LUMIGAN) 0.03 % ophthalmic solution Place 1 drop  into both eyes at bedtime.    [provider]  brimonidine-timolol (COMBIGAN) 0.2-0.5 % ophthalmic solution Place 1 drop into both eyes every 12 (twelve) hours.    [provider]  Multiple Vitamins-Calcium (ONE-A-DAY WOMENS PO) Take 1 tablet by mouth daily.      [provider]  NYSTATIN powder Apply 1 application topically daily as needed (sweat under breast). 11/23/19   [provider]  Omega-3 Fatty Acids (FISH OIL) 1000 MG CAPS Take 1,000 mg by mouth daily.    Lin Landsman, MD  Polyvinyl Alcohol (LUBRICANT DROPS OP) Place 1 drop into both eyes daily as needed (Dry eyes).    [provider]  predniSONE (DELTASONE) 5 MG tablet Take 5 mg by mouth daily.    Carol Ada, MD  Promethazine HCl 6.25 MG/5ML SOLN Take 5 mLs by mouth every 4 (four) hours as needed for cough. 02/09/21   [provider]  SPIRIVA RESPIMAT 1.25 MCG/ACT AERS INHALE 2 PUFFS INTO THE LUNGS DAILY 06/08/21   Collene Gobble, MD  Tiotropium Bromide Monohydrate (SPIRIVA RESPIMAT) 1.25 MCG/ACT AERS Inhale 2 puffs into the lungs daily. 03/15/21   Collene Gobble, MD  triamterene-hydrochlorothiazide (MAXZIDE-25) 37.5-25 MG per tablet Take 1 tablet by mouth daily.    [provider]    Physical Exam: Vitals:   06/05/2021 1147 06/30/2021 1245 06/13/2021 1400  BP: 119/71 112/72 106/71  Pulse: 98 90 89  Resp: 20 (!) 27 (!) 29  Temp: 97.7 F (36.5 C)    TempSrc: Oral    SpO2: (!) 87% 97% 95%    General: 76 y.o. ill appearing female resting in bed Eyes: PERRL, normal sclera ENMT: Nares patent w/o discharge, orophaynx clear, dentition normal, ears w/o discharge/lesions/ulcers Neck: Supple, trachea midline Cardiovascular: tachy +S1, S2, no m/g/r, equal pulses throughout Respiratory: absent sounds on left, decreased at right base; slightly increase WOB on 3L [place] GI: BS+, NDNT, no organomegaly noted MSK: No e/c/c Neuro: A&O x 3 (person, place, president), no focal  deficits Psyc: some confusion and flat affect, calm/cooperative  Labs on Admission: I have personally reviewed following labs and imaging studies  CBC: Recent Labs  Lab 06/17/2021 1211  WBC 19.6*  NEUTROABS 17.3*  HGB 11.9*  HCT 35.6*  MCV 97.0  PLT 751*   Basic Metabolic Panel: Recent Labs  Lab 06/19/2021 1151  NA 135  K 3.8  CL 98  CO2 27  GLUCOSE 103*  BUN 40*  CREATININE 0.76  CALCIUM 13.0*   GFR: CrCl cannot be calculated (Unknown ideal weight.). Liver Function Tests: Recent Labs  Lab 06/25/2021 1151  AST 23  ALT 14  ALKPHOS 69  BILITOT 1.4*  PROT 6.4*  ALBUMIN 2.5*   No results for input(s): LIPASE, AMYLASE in the last 168 hours. Recent Labs  Lab 06/24/2021 1249  AMMONIA 13   Coagulation Profile: No results for input(s): INR, PROTIME in the last 168 hours. Cardiac Enzymes: Recent Labs  Lab 06/21/2021 1211  CKTOTAL 57   BNP (last 3 results) No results for input(s):  PROBNP in the last 8760 hours. HbA1C: No results for input(s): HGBA1C in the last 72 hours. CBG: Recent Labs  Lab 06/21/2021 1156  GLUCAP 107*   Lipid Profile: No results for input(s): CHOL, HDL, LDLCALC, TRIG, CHOLHDL, LDLDIRECT in the last 72 hours. Thyroid Function Tests: No results for input(s): TSH, T4TOTAL, FREET4, T3FREE, THYROIDAB in the last 72 hours. Anemia Panel: No results for input(s): VITAMINB12, FOLATE, FERRITIN, TIBC, IRON, RETICCTPCT in the last 72 hours. Urine analysis: No results found for: COLORURINE, APPEARANCEUR, LABSPEC, PHURINE, GLUCOSEU, HGBUR, BILIRUBINUR, KETONESUR, PROTEINUR, UROBILINOGEN, NITRITE, LEUKOCYTESUR  Radiological Exams on Admission: DG Chest 2 View  Result Date: 06/12/2021 CLINICAL DATA:  Altered level of consciousness.  Hypoxia. EXAM: CHEST - 2 VIEW COMPARISON:  Chest radiographs 03/15/2021 FINDINGS: There is a new large left pleural effusion which appears partially loculated anteriorly. There is mild rightward shift of the mediastinal  structures, and the left heart border is obscured. There is associated atelectasis or consolidation in the left mid and lower lung. The right lung is clear. No pneumothorax is identified. No acute osseous abnormality is seen. IMPRESSION: New large left pleural effusion with left lung atelectasis or consolidation. Electronically Signed   By: Logan Bores M.D.   On: 06/21/2021 13:06   CT Head Wo Contrast  Result Date: 06/07/2021 CLINICAL DATA:  Provided history: Mental status change, unknown cause. Additional history provided: Patient found down today. EXAM: CT HEAD WITHOUT CONTRAST TECHNIQUE: Contiguous axial images were obtained from the base of the skull through the vertex without intravenous contrast. RADIATION DOSE REDUCTION: This exam was performed according to the departmental dose-optimization program which includes automated exposure control, adjustment of the mA and/or kV according to patient size and/or use of iterative reconstruction technique. COMPARISON:  Brain MRI 01/27/2021.  Head CT 05/15/2011. FINDINGS: Brain: Mild-to-moderate generalized cerebral atrophy. Comparatively mild cerebellar atrophy. There is no acute intracranial hemorrhage. No demarcated cortical infarct. No extra-axial fluid collection. No evidence of an intracranial mass. No midline shift. Vascular: No hyperdense vessel.  Atherosclerotic calcifications. Skull: Normal. Negative for fracture or focal lesion. Sinuses/Orbits: Visualized orbits show no acute finding. Small volume frothy secretions within the left sphenoid sinus. IMPRESSION: No evidence of acute intracranial abnormality. Mild-to-moderate generalized cerebral atrophy. Comparatively mild cerebellar atrophy. Mild left sphenoid sinusitis. Electronically Signed   By: Kellie Simmering D.O.   On: 06/07/2021 13:27    EKG: Independently reviewed. 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     - admit to inpt, med-surg     - given the extent of her disease and after discussion w/ PCCM, patient/family have elected to go with comfort care measures at this time     - EoL protocol started; palliative care consulted  Acute metabolic encephalopathy Hx of HTN Hx of cirrhosis A fib Hypercalcemia Normocytic anemia Severe protein calorie malnutrition Dehydration     - given the extent of her disease and after discussion w/ PCCM, patient/family have elected to go with comfort care measures at this time     - she will finish her current bag of IVF  DVT prophylaxis: none, comfort care measures only  Code Status: DNR, comfort care measures only  Family Communication: w/ family at bedside  Consults called: PCCM (Dr. Loletta Specter); Palliative Care   Status is: Inpatient  Remains inpatient appropriate because: severity of illness  Time spent coordinating admission: 90 minutes  [place]  [facility]ospitalists  If 7PM-7AM, please contact night-coverage [email or web address]  06/10/2021, 2:17 PM

## 2021-06-22 NOTE — ED Provider Notes (Signed)
Emergency Department Provider Note   I have reviewed the triage vital signs and the nursing notes.   HISTORY  Chief Complaint No chief complaint on file.   HPI Stacey Harmon is a 76 y.o. female with PMH of HTN and liver cirrhosis presents to the ED after family found her on the floor today.  She tells me that she got up this morning and was able to get around the house, make oatmeal for breakfast, and then went to the couch.  She was reaching for something off the sofa when she slid off following a short distance to the ground.  She denies head trauma.  She was unable to get up.  Family tried to call and check on her but when they did not get an answer they showed up for in person evaluation and ultimately found on the ground.  She estimates that she was on the floor for around 1 hour.  She denied having any pain, trouble breathing.  Denies chest pain or palpitations.  Denies headache or head injury.    Past Medical History:  Diagnosis Date   Hypertension    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[place])     Review of Systems  Constitutional: No fever/chills Eyes: No visual changes. ENT: No sore throat. Cardiovascular: Denies chest pain. Respiratory: Denies shortness of breath. Positive mild cough.  Gastrointestinal: No abdominal pain.  No nausea, no vomiting.  Genitourinary: Negative for dysuria. Musculoskeletal: Negative for back pain. Skin: Negative for rash. Neurological: Negative for headaches, focal weakness or numbness.  ____________________________________________   PHYSICAL EXAM:  VITAL SIGNS: ED Triage Vitals  Enc Vitals Group     BP 06/19/2021 1147 119/71     Pulse Rate 06/18/2021 1147 98     Resp 06/24/2021 1147 20     Temp 06/19/2021 1147 97.7 F (36.5 C)     Temp Source 06/14/2021 1147 Oral     SpO2 06/06/2021 1147 (!) 87 %   Constitutional: Alert. Well appearing and in no acute distress. Eyes: Conjunctivae are normal.  Head: Atraumatic. Nose: No  congestion/rhinnorhea. Mouth/Throat: Mucous membranes are moist.  Oropharynx non-erythematous. Neck: No stridor.  Cardiovascular: Normal rate, regular rhythm. Good peripheral circulation. Grossly normal heart sounds.   Respiratory: Normal respiratory effort.  No retractions. Lungs CTAB. Gastrointestinal: Soft and nontender. No distention.  Musculoskeletal: No lower extremity tenderness nor edema. No gross deformities of extremities. Neurologic:  Normal speech and language. No gross focal neurologic deficits are appreciated.  Skin:  Skin is warm, dry and intact. No rash noted.   ____________________________________________   LABS (all labs ordered are listed, but only abnormal results are displayed)  Labs Reviewed  COMPREHENSIVE METABOLIC PANEL - Abnormal; Notable for the following components:      Result Value   Glucose, Bld 103 (*)    BUN 40 (*)    Calcium 13.0 (*)    Total Protein 6.4 (*)    Albumin 2.5 (*)    Total Bilirubin 1.4 (*)    All other components within normal limits  URINALYSIS, ROUTINE W REFLEX MICROSCOPIC - Abnormal; Notable for the following components:   APPearance HAZY (*)    Hgb urine dipstick SMALL (*)    Ketones, ur 5 (*)    Leukocytes,Ua SMALL (*)    Bacteria, UA MANY (*)    All other components within normal limits  CBC WITH DIFFERENTIAL/PLATELET - Abnormal; Notable for the following components:   WBC 19.6 (*)    RBC 3.67 (*)  Hemoglobin 11.9 (*)    HCT 35.6 (*)    RDW 15.6 (*)    Platelets 464 (*)    Neutro Abs 17.3 (*)    Lymphs Abs 0.6 (*)    Monocytes Absolute 1.5 (*)    Abs Immature Granulocytes 0.19 (*)    All other components within normal limits  CBG MONITORING, ED - Abnormal; Notable for the following components:   Glucose-Capillary 107 (*)    All other components within normal limits  RESP PANEL BY RT-PCR (FLU A&B, COVID) ARPGX2  CULTURE, BLOOD (ROUTINE X 2)  CULTURE, BLOOD (ROUTINE X 2)  CK  AMMONIA    ____________________________________________  EKG   EKG Interpretation  Date/Time:  Thursday June 22 2021 13:46:37 EST Ventricular Rate:  145 PR Interval:  121 QRS Duration: 73 QT Interval:  247 QTC Calculation: 384 R Axis:   65 Text Interpretation: Atrial fibrillation Abnormal R-wave progression, early transition Repolarization abnormality, prob rate related Confirmed by Malvin Johns [phone]) on 06/23/2021 9:42:42 AM        ____________________________________________  RADIOLOGY  CT head and CXR.   ____________________________________________   PROCEDURES  Procedure(s) performed:   Procedures  CRITICAL CARE Performed by: Margette Fast Total critical care time: 35 minutes Critical care time was exclusive of separately billable procedures and treating other patients. Critical care was necessary to treat or prevent imminent or life-threatening deterioration. Critical care was time spent personally by me on the following activities: development of treatment plan with patient and/or surrogate as well as nursing, discussions with consultants, evaluation of patient's response to treatment, examination of patient, obtaining history from patient or surrogate, ordering and performing treatments and interventions, ordering and review of laboratory studies, ordering and review of radiographic studies, pulse oximetry and re-evaluation of patient's condition.  Nanda Quinton, MD Emergency Medicine  ____________________________________________   INITIAL IMPRESSION / ASSESSMENT AND PLAN / ED COURSE  Pertinent labs & imaging results that were available during my care of the patient were reviewed by me and considered in my medical decision making (see chart for details).   This patient is Presenting for Evaluation of AMS and hypoxemia, which does require a range of treatment options, and is a complaint that involves a high risk of morbidity and mortality.  The Differential  Diagnoses includes but is not exclusive to alcohol, illicit or prescription medications, intracranial pathology such as stroke, intracerebral hemorrhage, fever or infectious causes including sepsis, hypoxemia, uremia, trauma, endocrine related disorders such as diabetes, hypoglycemia, thyroid-related diseases, etc.   Critical Interventions- supplemental O2, IVF, and CXR with labs and CT imaging.    Reassessment after intervention: Patient is awake and alert and maintaining O2 sats well.    I did Additional Historical Information from son, as the patient is confused.  I decided to review pertinent External Data, and in summary patient with Pulmonology notes in our system but no oncology notes for review.   Clinical Laboratory Tests Ordered, included CBC with significant leukocytosis. No AKI. CK normal. COVID and Flu are negative.   Radiologic Tests Ordered, included CT head and CXR. Large left pleural effusion. No large bleed on CT. I independently evaluated the images.   Cardiac Monitor Tracing which shows NSR with brief run of A-fib with RVR that resolved prior to rate controlling meds.    Social Determinants of Health Risk lives alone but with family support.   Reevaluation with update and discussion with patient and son at bedside. CTA PE pending. A-fib with RVR briefly  but not sustained. Will need admit for hypoxemia and large pleural effusion, likely malignant.   Consult complete with Hospitalist  Discussed patient's case with [facility] to request admission. Patient and family (if present) updated with plan. Care transferred to [facility] service.  I reviewed all nursing notes, vitals, pertinent old records, EKGs, labs, imaging (as available).   Disposition: admit  ____________________________________________  FINAL CLINICAL IMPRESSION(S) / ED DIAGNOSES  Final diagnoses:  Acute respiratory failure with hypoxia (HCC)  Pleural effusion on left  Atrial fibrillation with RVR (HCC)      MEDICATIONS GIVEN DURING THIS VISIT:  Medications  lactated ringers infusion (0 mLs Intravenous Stopped 06/08/2021 1553)  sodium chloride (PF) 0.9 % injection (  Canceled Entry 06/10/2021 1745)  sodium chloride 0.9 % bolus 500 mL (0 mLs Intravenous Stopped 07/04/2021 1553)  iohexol (OMNIPAQUE) 350 MG/ML injection 100 mL (61 mLs Intravenous Contrast Given 06/04/2021 1443)     Note:  This document was prepared using Dragon voice recognition software and may include unintentional dictation errors.  Nanda Quinton, MD, [facility] Emergency Medicine    Xavier Fournier, [facility], MD 06/28/21 [phone]

## 2021-06-22 NOTE — ED Notes (Signed)
This writer was in the room collecting blood when pt's HR spiked to 140s and monitor showed a-fib. Pt assessed and in no acute distress. EDP Long MD called to bedside and attempted to ascertain history of afib from pt and family. Cardizem drip ordered. Before cardizem could be initiated, pt rhythm converted out of afib into SR in the 80s-90s. Holding cardizem per Long MD esp given pt's BP 100s/70s.

## 2021-06-22 NOTE — ED Notes (Signed)
Pt placed on 2 liters O2 n/c for sats of 87% on room air

## 2021-06-22 NOTE — ED Triage Notes (Signed)
Pt here from home with c/o aloc was last seen yesterday by family was found on floor today , approx 24 hr ,350 of fluids by ems , cbg 151

## 2021-06-22 NOTE — ED Notes (Signed)
ED TO INPATIENT HANDOFF REPORT  ED Nurse Name and Phone #: Fabio Neighbors RN  S Name/Age/Gender Stacey Harmon 76 y.o. female Room/Bed: WA07/WA07  Code Status   Code Status: DNR  Home/SNF/Other Home Patient oriented to: self and place Is this baseline? Yes   Triage Complete: Triage complete  Chief Complaint PNA (pneumonia) [J18.9] Multifocal pneumonia [J18.9]  Triage Note Pt here from home with c/o aloc was last seen yesterday by family was found on floor today , approx 24 hr ,350 of fluids by ems , cbg 151    Allergies Allergies  Allergen Reactions   Penicillins Swelling    Reaction: 19 years    Level of Care/Admitting Diagnosis ED Disposition     ED Disposition  [place]: [place] [100102]  Level of Care: Med-Surg [16]  May admit patient to [facility] or [facility] if equivalent level of care is available:: No  Covid Evaluation: Confirmed COVID Negative  Diagnosis: Multifocal pneumonia [2563893]  Admitting Physician: Jonnie Finner [[id number]]  Attending Physician: Jonnie Finner [[id number]]  Estimated length of stay: past midnight tomorrow  Certification:: I certify this patient will need inpatient services for at least 2 midnights          B Medical/Surgery History Past Medical History:  Diagnosis Date   Hypertension    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[place])    Past Surgical History:  Procedure Laterality Date   BLADDER SURGERY     BRONCHIAL BIOPSY  02/27/2021   Procedure: BRONCHIAL BIOPSIES;  Surgeon: Collene Gobble, MD;  Location: [facility] ENDOSCOPY;  Service: Pulmonary;;   BRONCHIAL BRUSHINGS  02/27/2021   Procedure: BRONCHIAL BRUSHINGS;  Surgeon: Collene Gobble, MD;  Location: [facility] ENDOSCOPY;  Service: Pulmonary;;   BRONCHIAL NEEDLE ASPIRATION BIOPSY  02/27/2021   Procedure: BRONCHIAL NEEDLE ASPIRATION BIOPSIES;  Surgeon: Collene Gobble, MD;  Location: [facility] ENDOSCOPY;  Service: Pulmonary;;   COLONOSCOPY      VIDEO BRONCHOSCOPY WITH ENDOBRONCHIAL NAVIGATION N/A 02/27/2021   Procedure: ROBOTIC [place];  Surgeon: Collene Gobble, MD;  Location: [facility] ENDOSCOPY;  Service: Pulmonary;  Laterality: N/A;   VIDEO BRONCHOSCOPY WITH ENDOBRONCHIAL ULTRASOUND N/A 02/27/2021   Procedure: VIDEO BRONCHOSCOPY WITH ENDOBRONCHIAL ULTRASOUND;  Surgeon: Collene Gobble, MD;  [facility] ENDOSCOPY;  Service: Pulmonary;  Laterality: N/A;   VIDEO BRONCHOSCOPY WITH RADIAL ENDOBRONCHIAL ULTRASOUND  02/27/2021   Procedure: VIDEO BRONCHOSCOPY WITH RADIAL ENDOBRONCHIAL ULTRASOUND;  Surgeon: Collene Gobble, MD;  Location: [facility] ENDOSCOPY;  Service: Pulmonary;;     A IV Location/Drains/Wounds Patient Lines/Drains/Airways Status     Active Line/Drains/Airways     Name Placement date Placement time Site Days   Peripheral IV 07/04/2021 20 G Left Antecubital 06/16/2021  1143  Antecubital  less than 1   Peripheral IV 06/23/2021 20 G 1" Right Antecubital 06/04/2021  1353  Antecubital  less than 1            Intake/Output Last 24 hours  Intake/Output Summary (Last 24 hours) at 07/04/2021 2123 Last data filed at 06/21/2021 1529 Gross per 24 hour  Intake 93.24 ml  Output --  Net 93.24 ml    Labs/Imaging Results for orders placed or performed during the hospital encounter of 06/23/2021 (from the past 48 hour(s))  Comprehensive metabolic panel     Status: Abnormal   Collection Time: 06/06/2021 11:51 AM  Result Value Ref Range   Sodium 135 135 -  145 mmol/L   Potassium 3.8 3.5 - 5.1 mmol/L   Chloride 98 98 - 111 mmol/L   CO2 27 22 - 32 mmol/L   Glucose, Bld 103 (H) 70 - 99 mg/dL    Comment: Glucose reference range applies only to samples taken after fasting for at least 8 hours.   BUN 40 (H) 8 - 23 mg/dL   Creatinine, Ser 0.76 0.44 - 1.00 mg/dL   Calcium 13.0 (H) 8.9 - 10.3 mg/dL   Total Protein 6.4 (L) 6.5 - 8.1 g/dL   Albumin 2.5 (L) 3.5 - 5.0 g/dL   AST 23 15 - 41 U/L   ALT 14 0 - 44 U/L    Alkaline Phosphatase 69 38 - 126 U/L   Total Bilirubin 1.4 (H) 0.3 - 1.2 mg/dL   GFR, Estimated >60 >60 mL/min    Comment: (NOTE) Calculated using the CKD-EPI Creatinine Equation (2021)    Anion gap 10 5 - 15    Comment: Performed at [facility], [place] [address], [place], [place] [address]  CBG monitoring, ED     Status: Abnormal   Collection Time: 06/13/2021 11:56 AM  Result Value Ref Range   Glucose-Capillary 107 (H) 70 - 99 mg/dL    Comment: Glucose reference range applies only to samples taken after fasting for at least 8 hours.  CK     Status: None   Collection Time: 06/11/2021 12:11 PM  Result Value Ref Range   Total CK 57 38 - 234 U/L    Comment: Performed at [facility], [place] [address], [place], [place] [address]  CBC with Differential     Status: Abnormal   Collection Time: 06/25/2021 12:11 PM  Result Value Ref Range   WBC 19.6 (H) 4.0 - 10.5 K/uL   RBC 3.67 (L) 3.87 - 5.11 MIL/uL   Hemoglobin 11.9 (L) 12.0 - 15.0 g/dL   HCT 35.6 (L) 36.0 - 46.0 %   MCV 97.0 80.0 - 100.0 fL   MCH 32.4 26.0 - 34.0 pg   MCHC 33.4 30.0 - 36.0 g/dL   RDW 15.6 (H) 11.5 - 15.5 %   Platelets 464 (H) 150 - 400 K/uL   nRBC 0.0 0.0 - 0.2 %   Neutrophils Relative % 89 %   Neutro Abs 17.3 (H) 1.7 - 7.7 K/uL   Lymphocytes Relative 3 %   Lymphs Abs 0.6 (L) 0.7 - 4.0 K/uL   Monocytes Relative 7 %   Monocytes Absolute 1.5 (H) 0.1 - 1.0 K/uL   Eosinophils Relative 0 %   Eosinophils Absolute 0.0 0.0 - 0.5 K/uL   Basophils Relative 0 %   Basophils Absolute 0.0 0.0 - 0.1 K/uL   Immature Granulocytes 1 %   Abs Immature Granulocytes 0.19 (H) 0.00 - 0.07 K/uL    Comment: Performed at [facility], [place] [address], [place], [place] [address]  Resp Panel by RT-PCR (Flu A&B, Covid) Nasopharyngeal Swab     Status: None   Collection Time: 06/11/2021 12:49 PM   Specimen: Nasopharyngeal Swab; Nasopharyngeal(NP) swabs in vial transport medium  Result Value Ref  Range   SARS Coronavirus 2 by RT PCR NEGATIVE NEGATIVE    Comment: (NOTE) SARS-CoV-2 target nucleic acids are NOT DETECTED.  The SARS-CoV-2 RNA is generally detectable in upper respiratory specimens during the acute phase of infection. The lowest concentration of SARS-CoV-2 viral copies this assay can detect is 138 copies/mL. A negative result does not preclude SARS-Cov-2 infection  and should not be used as the sole basis for treatment or other patient management decisions. A negative result may occur with  improper specimen collection/handling, submission of specimen other than nasopharyngeal swab, presence of viral mutation(s) within the areas targeted by this assay, and inadequate number of viral copies(<138 copies/mL). A negative result must be combined with clinical observations, patient history, and epidemiological information. The expected result is Negative.  Fact Sheet for Patients:  EntrepreneurPulse.com.au  Fact Sheet for Healthcare Providers:  IncredibleEmployment.be  This test is no t yet approved or cleared by the [place] FDA and  has been authorized for detection and/or diagnosis of SARS-CoV-2 by FDA under an Emergency Use Authorization (EUA). This EUA will remain  in effect (meaning this test can be used) for the duration of the COVID-19 declaration under Section 564(b)(1) of the Act, 21 U.S.C.section 360bbb-3(b)(1), unless the authorization is terminated  or revoked sooner.       Influenza A by PCR NEGATIVE NEGATIVE   Influenza B by PCR NEGATIVE NEGATIVE    Comment: (NOTE) The Xpert Xpress SARS-CoV-2/FLU/RSV plus assay is intended as an aid in the diagnosis of influenza from Nasopharyngeal swab specimens and should not be used as a sole basis for treatment. Nasal washings and aspirates are unacceptable for Xpert Xpress SARS-CoV-2/FLU/RSV testing.  Fact Sheet for  Patients: EntrepreneurPulse.com.au  Fact Sheet for Healthcare Providers: IncredibleEmployment.be  This test is not yet approved or cleared by the [place] FDA and has been authorized for detection and/or diagnosis of SARS-CoV-2 by FDA under an Emergency Use Authorization (EUA). This EUA will remain in effect (meaning this test can be used) for the duration of the COVID-19 declaration under Section 564(b)(1) of the Act, 21 U.S.C. section 360bbb-3(b)(1), unless the authorization is terminated or revoked.  Performed at [facility], [place] [address], [place], [place] [address]   Ammonia     Status: None   Collection Time: 06/27/2021 12:49 PM  Result Value Ref Range   Ammonia 13 9 - 35 umol/L    Comment: Performed at [facility], [place] [address] [place], [place] [address]  Urinalysis, Routine w reflex microscopic Urine, Clean Catch     Status: Abnormal   Collection Time: 06/05/2021  1:56 PM  Result Value Ref Range   Color, Urine YELLOW YELLOW   APPearance HAZY (A) CLEAR   Specific Gravity, Urine 1.017 1.005 - 1.030   pH 5.0 5.0 - 8.0   Glucose, UA NEGATIVE NEGATIVE mg/dL   Hgb urine dipstick SMALL (A) NEGATIVE   Bilirubin Urine NEGATIVE NEGATIVE   Ketones, ur 5 (A) NEGATIVE mg/dL   Protein, ur NEGATIVE NEGATIVE mg/dL   Nitrite NEGATIVE NEGATIVE   Leukocytes,Ua SMALL (A) NEGATIVE   RBC / HPF 0-5 0 - 5 RBC/hpf   WBC, UA 6-10 0 - 5 WBC/hpf   Bacteria, UA MANY (A) NONE SEEN   Squamous Epithelial / LPF 0-5 0 - 5   Hyaline Casts, UA PRESENT     Comment: Performed at [facility], [place] [address] [place], [place] [address]   DG Chest 2 View  Result Date: 06/04/2021 CLINICAL DATA:  Altered level of consciousness.  Hypoxia. EXAM: CHEST - 2 VIEW COMPARISON:  Chest radiographs 03/15/2021 FINDINGS: There is a new large left pleural effusion which appears partially loculated anteriorly. There is  mild rightward shift of the mediastinal structures, and the left heart border is obscured. There is associated atelectasis or consolidation in the left mid  and lower lung. The right lung is clear. No pneumothorax is identified. No acute osseous abnormality is seen. IMPRESSION: New large left pleural effusion with left lung atelectasis or consolidation. Electronically Signed   By: Logan Bores M.D.   On: 06/18/2021 13:06   CT Head Wo Contrast  Result Date: 06/29/2021 CLINICAL DATA:  Provided history: Mental status change, unknown cause. Additional history provided: Patient found down today. EXAM: CT HEAD WITHOUT CONTRAST TECHNIQUE: Contiguous axial images were obtained from the base of the skull through the vertex without intravenous contrast. RADIATION DOSE REDUCTION: This exam was performed according to the departmental dose-optimization program which includes automated exposure control, adjustment of the mA and/or kV according to patient size and/or use of iterative reconstruction technique. COMPARISON:  Brain MRI 01/27/2021.  Head CT 05/15/2011. FINDINGS: Brain: Mild-to-moderate generalized cerebral atrophy. Comparatively mild cerebellar atrophy. There is no acute intracranial hemorrhage. No demarcated cortical infarct. No extra-axial fluid collection. No evidence of an intracranial mass. No midline shift. Vascular: No hyperdense vessel.  Atherosclerotic calcifications. Skull: Normal. Negative for fracture or focal lesion. Sinuses/Orbits: Visualized orbits show no acute finding. Small volume frothy secretions within the left sphenoid sinus. IMPRESSION: No evidence of acute intracranial abnormality. Mild-to-moderate generalized cerebral atrophy. Comparatively mild cerebellar atrophy. Mild left sphenoid sinusitis. Electronically Signed   By: Kellie Simmering D.O.   On: 06/14/2021 13:27   CT Angio Chest PE W and/or Wo Contrast  Result Date: 06/17/2021 CLINICAL DATA:  Pulmonary embolism (PE) suspected, high prob  EXAM: CT ANGIOGRAPHY CHEST WITH CONTRAST TECHNIQUE: Multidetector CT imaging of the chest was performed using the standard protocol during bolus administration of intravenous contrast. Multiplanar CT image reconstructions and MIPs were obtained to evaluate the vascular anatomy. RADIATION DOSE REDUCTION: This exam was performed according to the departmental dose-optimization program which includes automated exposure control, adjustment of the mA and/or kV according to patient size and/or use of iterative reconstruction technique. CONTRAST:  35mL OMNIPAQUE IOHEXOL 350 MG/ML SOLN COMPARISON:  CT chest August 2022, multiple priors FINDINGS: Cardiovascular: Satisfactory opacification of the pulmonary arteries to the lobar level. Motion artifacts limits evaluation of the more distal pulmonary arteries. No evidence of central pulmonary embolism. There is mediastinal shift to the right. The thoracic aorta is normal in caliber. Normal heart size. No pericardial effusion. Mediastinum/Nodes: Bulky subcarinal lymphadenopathy measuring 3.6 x 3.2 cm. The thyroid gland appears normal. Lungs/Pleura: On the left side, there is complete collapse of the left upper lobe. There is dense consolidation of the inferior left lower lobe with more patchy nodular opacities in the posterior aspect. There appears to be extensive mucous plugging of the airways with abrupt cut off of the left mainstem bronchus. In the right lung, there is emphysematous changes without appreciable consolidative process, although finer evaluation of the lungs is limited by motion artifact. Musculoskeletal: No aggressive osseous lesions. There is a left breast mass measuring 2.1 x 1.2 cm. Upper abdomen: The visualized upper abdomen is unremarkable. Review of the MIP images confirms the above findings. IMPRESSION: 1. No findings of pulmonary embolism to the level of the lobar pulmonary arteries. Evaluation of the more distal pulmonary arteries limited by motion  artifact. 2. There are progressive and extensive changes in the left lung since most recent PET-CT in August 2022 demonstrated a left lower lobe pulmonary mass. There is now complete collapse of the left upper lobe. There is dense consolidation of the inferior left lower lobe along with new nodular opacities in the dependent aspect. There is abrupt  cut off of the left mainstem bronchus, with essentially no aeration of the more distal bronchi. Overall, findings are assumed to represent progression of underlying malignancy although a discrete mass is not able to be measured given extensive surrounding consolidation. Superimposed infection/aspiration is a consideration. Extensive plugging of the airways may be due to a combination of tumor infiltration and/or mucous impaction. 3. Large subcarinal lymphadenopathy, assumed to represent metastatic disease. These results were called by telephone at the time of interpretation on 06/17/2021 at 3:23 pm to provider Dr. Marylyn Ishihara, who verbally acknowledged these results. Electronically Signed   By: Albin Felling M.D.   On: 06/23/2021 15:25    Pending Labs Unresulted Labs (From admission, onward)     Start     Ordered   06/16/2021 1317  Culture, blood (routine x 2)  BLOOD CULTURE X 2,   STAT,   Status:  Canceled      06/09/2021 1317            Vitals/Pain Today's Vitals   06/20/2021 1430 06/23/2021 1554 06/13/2021 1800 06/28/2021 1900  BP: 126/76  106/66 104/70  Pulse: 97  92 91  Resp: (!) 26  (!) 21 (!) 27  Temp:      TempSrc:      SpO2: 96%  96% 95%  PainSc:  0-No pain      Isolation Precautions No active isolations  Medications Medications  lactated ringers infusion (0 mLs Intravenous Stopped 06/15/2021 1553)  sodium chloride (PF) 0.9 % injection (  Canceled Entry 06/17/2021 1745)  acetaminophen (TYLENOL) tablet 650 mg (has no administration in time range)    Or  acetaminophen (TYLENOL) suppository 650 mg (has no administration in time range)  haloperidol  (HALDOL) tablet 0.5 mg (has no administration in time range)    Or  haloperidol (HALDOL) 2 MG/ML solution 0.5 mg (has no administration in time range)    Or  haloperidol lactate (HALDOL) injection 0.5 mg (has no administration in time range)  ondansetron (ZOFRAN-ODT) disintegrating tablet 4 mg (has no administration in time range)    Or  ondansetron (ZOFRAN) injection 4 mg (has no administration in time range)  glycopyrrolate (ROBINUL) tablet 1 mg (has no administration in time range)    Or  glycopyrrolate (ROBINUL) injection 0.2 mg (has no administration in time range)    Or  glycopyrrolate (ROBINUL) injection 0.2 mg (has no administration in time range)  antiseptic oral rinse (BIOTENE) solution 15 mL (has no administration in time range)  polyvinyl alcohol (LIQUIFILM TEARS) 1.4 % ophthalmic solution 1 drop (has no administration in time range)  morphine CONCENTRATE 10 MG/0.5ML oral solution 5 mg (has no administration in time range)    Or  morphine CONCENTRATE 10 MG/0.5ML oral solution 5 mg (has no administration in time range)  morphine 2 MG/ML injection 1 mg (has no administration in time range)  LORazepam (ATIVAN) tablet 1 mg (has no administration in time range)    Or  LORazepam (ATIVAN) 2 MG/ML concentrated solution 1 mg (has no administration in time range)    Or  LORazepam (ATIVAN) injection 1 mg (has no administration in time range)  sodium chloride 0.9 % bolus 500 mL (0 mLs Intravenous Stopped 06/04/2021 1553)  iohexol (OMNIPAQUE) 350 MG/ML injection 100 mL (61 mLs Intravenous Contrast Given 06/30/2021 1443)    Mobility walks with person assist High fall risk   Focused Assessments    R Recommendations: See Admitting Provider Note  Report given to:  [facility] RN  Additional  Notes:

## 2021-06-22 NOTE — ED Notes (Signed)
IV fluids turned off by Dr. Marylyn Ishihara

## 2021-06-23 DIAGNOSIS — L8990 Pressure ulcer of unspecified site, unspecified stage: Secondary | ICD-10-CM | POA: Insufficient documentation

## 2021-06-23 DIAGNOSIS — G9341 Metabolic encephalopathy: Secondary | ICD-10-CM

## 2021-06-23 DIAGNOSIS — J9811 Atelectasis: Secondary | ICD-10-CM | POA: Diagnosis present

## 2021-06-23 DIAGNOSIS — I4891 Unspecified atrial fibrillation: Secondary | ICD-10-CM

## 2021-06-23 DIAGNOSIS — J9621 Acute and chronic respiratory failure with hypoxia: Secondary | ICD-10-CM | POA: Diagnosis not present

## 2021-06-23 DIAGNOSIS — J90 Pleural effusion, not elsewhere classified: Secondary | ICD-10-CM

## 2021-06-23 DIAGNOSIS — E860 Dehydration: Secondary | ICD-10-CM

## 2021-06-23 DIAGNOSIS — I10 Essential (primary) hypertension: Secondary | ICD-10-CM

## 2021-06-23 DIAGNOSIS — K743 Primary biliary cirrhosis: Secondary | ICD-10-CM

## 2021-06-23 MED ORDER — OXYBUTYNIN CHLORIDE 5 MG PO TABS
5.0000 mg | ORAL_TABLET | Freq: Every day | ORAL | Status: DC
  Administered 2021-06-24: 5 mg via ORAL
  Filled 2021-06-23: qty 1

## 2021-06-23 MED ORDER — ALPRAZOLAM 0.25 MG PO TABS
0.2500 mg | ORAL_TABLET | Freq: Every day | ORAL | Status: DC
  Filled 2021-06-23: qty 1

## 2021-06-23 MED ORDER — UMECLIDINIUM BROMIDE 62.5 MCG/ACT IN AEPB
1.0000 | INHALATION_SPRAY | Freq: Every day | RESPIRATORY_TRACT | Status: DC
  Filled 2021-06-23: qty 7

## 2021-06-23 MED ORDER — METOPROLOL TARTRATE 5 MG/5ML IV SOLN
2.5000 mg | Freq: Three times a day (TID) | INTRAVENOUS | Status: DC
  Administered 2021-06-23 – 2021-06-24 (×3): 2.5 mg via INTRAVENOUS
  Filled 2021-06-23 (×3): qty 5

## 2021-06-23 MED ORDER — NYSTATIN 100000 UNIT/GM EX POWD: 1.0000 "application " | Freq: Every day | CUTANEOUS | Status: DC | PRN

## 2021-06-23 MED ORDER — LATANOPROST 0.005 % OP SOLN
1.0000 [drp] | Freq: Every day | OPHTHALMIC | Status: DC
  Administered 2021-06-23 – 2021-06-25 (×3): 1 [drp] via OPHTHALMIC
  Filled 2021-06-23: qty 2.5

## 2021-06-23 MED ORDER — LIP MEDEX EX OINT
TOPICAL_OINTMENT | CUTANEOUS | Status: DC | PRN
  Filled 2021-06-23: qty 7

## 2021-06-23 MED ORDER — BRIMONIDINE TARTRATE-TIMOLOL 0.2-0.5 % OP SOLN: 1.0000 [drp] | Freq: Two times a day (BID) | OPHTHALMIC | Status: DC

## 2021-06-23 NOTE — Progress Notes (Signed)
Chaplain responded to referral consultation.  Chaplain met with patient and family who were at her bedside - son Quillian Quince and Daughter in [place].  Patient was alert but slow speaking.  Services of spiritual care were introduced to [name] family.  They welcomed a visit from [place], who provided spiritual care and comfort.  Chaplain gave Stacey Harmon and opportunity to speak of her life narrative and family.  She grew up in [place], [place] and was [place] farm -where her large family grew up together.  "_0 /20/23 1500  Clinical Encounter Type  Visited With Patient and family together  Visit Type Initial  Referral From Nurse  Consult/Referral To Chaplain  Spiritual Encounters  Spiritual Needs Sacred text;Prayer;Emotional  Stress Factors  Patient Stress Factors Family relationships;Loss of control;Major life changes  Family Stress Factors Family relationships;Health changes;Major life changes

## 2021-06-23 NOTE — Progress Notes (Signed)
PROGRESS NOTE    Stacey Harmon  KDX:[id number] DOB: 1946/01/14 DOA: 06/11/2021 PCP: Lin Landsman, MD   No chief complaint on file.   Brief Narrative:  Patient 76 year old female history of hypertension, cirrhosis of the liver, left lung neoplasm presented with generalized weakness and noted to be confused on presentation.  It was noted that patient had generally had not been herself for several weeks and family did not know patient carried a diagnosis of likely lung cancer and has been actively hiding it from them.  Patient noted to be found down at home EMS was called brought to the ED with 3 days of worsening weakness and shortness of breath.  Chest x-ray done showed a large pleural effusion and possible pneumonia.  CT angiogram chest done was negative for PE but did show complete collapse of the left lung with progressive neoplastic disease.  PCCM consulted and decision was made to transition to full comfort measures.   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 -Patient presenting with some shortness of breath, noted to be hypoxic, chest x-ray done concerning for large left pleural effusion. -CT angiogram chest done negative for PE however did show progressive and extensive changes to the left lung with left lower lobe pulmonary mass with complete collapse of left upper lobe and dense consolidation inferior left lower lobe with new nodular opacities and obstruction of left mainstem bronchus.  Large subcarinal lymphadenopathy noted to  represent metastatic disease. -Patient seen in consultation by PCCM who reviewed images discussed options with family about pursuing aggressive treatment which patient did not want in the past versus focusing on comfort which family opted for focusing on comfort as Patient to be comfortable in the final days/weeks. -It was felt per PCCM that thoracentesis unlikely to provide any symptomatic benefit in the setting of occlusive left mainstem tumor. -Palliative care consulted to help with symptom management, goals of care, transitioning to probable residential versus home hospice. -Continue morphine as needed. -Palliative care consultation pending.  2.  Acute metabolic encephalopathy -Likely secondary to problem #1. -Patient now full comfort measures.  3.  History of cirrhosis/hypertension -Placed on IV Lopressor.  4.  Atrial fibrillation -Noted on EKG. -Patient with no prior history of A. fib. -Likely secondary to problem #1. -Patient on comfort measures. -Place on Lopressor 2.5 mg IV every 8 hours. -Not a candidate for anticoagulation as patient being transitioned to full comfort measures.  5.  Severe protein calorie malnutrition -Secondary to problem #1. -Patient being transitioned to full comfort measures.  6.  Dehydration -Patient seen by PCCM and due to extent of disease have elected to go with comfort care measures. -Patient to finish bag of IV fluids.   DVT prophylaxis: Comfort care. Code Status: DNR Family Communication: Updated patient, son at bedside. Disposition:   Status is: Inpatient  Remains inpatient appropriate because: Severity of illness       Consultants:  PCCM: Dr. Tamala Julian 06/08/2021 Palliative care pending.   Procedures: CT angiogram chest 06/21/2021 CT head without contrast 06/13/2021 Chest x-ray 07/03/2021  Antimicrobials:  None   Subjective: Laying in bed.  Seems somewhat anxious.  Some complaints of shortness of breath.  No chest  pain.  Objective: Vitals:   06/23/21 0204 06/23/21 0206 06/23/21 0930 06/23/21 1425  BP: (!) 100/57 (!) 93/52 122/64 108/81  Pulse: 95 92 (!) 46 (!) 105  Resp: 12 12 18 18   Temp: 97.9 F (36.6 C) 97.6 F (36.4 C) 98.2 F (36.8 C) 98.6 F (37 C)  TempSrc: Oral  Oral Oral  SpO2: 96% 96% (!) 84% 92%    Intake/Output Summary (Last 24 hours) at 06/23/2021 1636 Last data filed at 06/23/2021 0930 Gross per 24 hour  Intake 393.33 ml  Output 200 ml  Net 193.33 ml   There were no vitals filed for this visit.  Examination:  General exam: Frail.  Temporal wasting.  Respiratory system: Absent breath sounds on the left.  Increased respiratory rate.   Cardiovascular system: Tachycardia.  No JVD, no murmurs rubs or gallops.  No lower extremity edema.  Gastrointestinal system: Abdomen is nondistended, soft and nontender. No organomegaly or masses felt. Normal bowel sounds heard. Central nervous system: Alert and oriented. No focal neurological deficits. Extremities: Symmetric 5 x 5 power. Skin: No rashes, lesions or ulcers Psychiatry: Judgement and insight appear normal. Mood & affect appropriate.     Data Reviewed: I have personally reviewed following labs and imaging studies  CBC: Recent Labs  Lab 06/20/2021 1211  WBC 19.6*  NEUTROABS 17.3*  HGB 11.9*  HCT 35.6*  MCV 97.0  PLT 464*    Basic Metabolic Panel: Recent Labs  Lab 06/28/2021 1151  NA 135  K 3.8  CL 98  CO2 27  GLUCOSE 103*  BUN 40*  CREATININE 0.76  CALCIUM 13.0*    GFR: CrCl cannot be calculated (Unknown ideal weight.).  Liver Function Tests: Recent Labs  Lab 06/21/2021 1151  AST 23  ALT 14  ALKPHOS 69  BILITOT 1.4*  PROT 6.4*  ALBUMIN 2.5*    CBG: Recent Labs  Lab 06/08/2021 1156  GLUCAP 107*     Recent Results (from the past 240 hour(s))  Resp Panel by RT-PCR (Flu A&B, Covid) Nasopharyngeal Swab     Status: None   Collection Time: 06/21/2021 12:49 PM   Specimen: Nasopharyngeal Swab;  Nasopharyngeal(NP) swabs in vial transport medium  Result Value Ref Range Status   SARS Coronavirus 2 by RT PCR NEGATIVE NEGATIVE Final    Comment: (NOTE) SARS-CoV-2 target nucleic acids are NOT DETECTED.  The SARS-CoV-2 RNA is generally detectable in upper respiratory specimens during the acute phase of infection. The lowest concentration of SARS-CoV-2 viral copies this assay can detect is 138 copies/mL. A negative result does not preclude SARS-Cov-2 infection and should not be used as the sole basis for treatment or other patient management decisions. A negative result may occur with  improper specimen collection/handling, submission of specimen other than nasopharyngeal swab, presence of viral mutation(s) within the areas targeted by this assay, and inadequate number of viral copies(<138 copies/mL). A negative result must be combined with clinical observations, patient history, and epidemiological information. The expected result is Negative.  Fact Sheet for Patients:  EntrepreneurPulse.com.au  Fact Sheet for Healthcare Providers:  IncredibleEmployment.be  This test is no t yet approved or cleared by the [place] FDA and  has been authorized for detection and/or diagnosis of SARS-CoV-2 by FDA under an Emergency Use Authorization (EUA). This EUA will remain  in effect (meaning this test can be used) for  the duration of the COVID-19 declaration under Section 564(b)(1) of the Act, 21 U.S.C.section 360bbb-3(b)(1), unless the authorization is terminated  or revoked sooner.       Influenza A by PCR NEGATIVE NEGATIVE Final   Influenza B by PCR NEGATIVE NEGATIVE Final    Comment: (NOTE) The Xpert Xpress SARS-CoV-2/FLU/RSV plus assay is intended as an aid in the diagnosis of influenza from Nasopharyngeal swab specimens and should not be used as a sole basis for treatment. Nasal washings and aspirates are unacceptable for Xpert Xpress  SARS-CoV-2/FLU/RSV testing.  Fact Sheet for Patients: EntrepreneurPulse.com.au  Fact Sheet for Healthcare Providers: IncredibleEmployment.be  This test is not yet approved or cleared by the [place] FDA and has been authorized for detection and/or diagnosis of SARS-CoV-2 by FDA under an Emergency Use Authorization (EUA). This EUA will remain in effect (meaning this test can be used) for the duration of the COVID-19 declaration under Section 564(b)(1) of the Act, 21 U.S.C. section 360bbb-3(b)(1), unless the authorization is terminated or revoked.  Performed at [facility], [place] [address] [place], [place] [address]   Culture, blood (routine x 2)     Status: None (Preliminary result)   Collection Time: 07/01/2021  1:56 PM   Specimen: Right Antecubital; Blood  Result Value Ref Range Status   Specimen Description   Final    RIGHT ANTECUBITAL Performed at [facility] [address] [place], [place] [address]    Special Requests   Final    BOTTLES DRAWN AEROBIC AND ANAEROBIC Blood Culture results may not be optimal due to an excessive volume of blood received in culture bottles Performed at [facility] [address] [place], [place] [address]    Culture   Final    NO GROWTH < 24 HOURS Performed at [facility] [address], [place], [place] [address]    Report Status PENDING  Incomplete  Culture, blood (routine x 2)     Status: None (Preliminary result)   Collection Time: 06/24/2021  1:56 PM   Specimen: BLOOD  Result Value Ref Range Status   Specimen Description   Final    BLOOD LEFT ANTECUBITAL Performed at [facility] [address], [place], [place] [address]    Special Requests   Final    BOTTLES DRAWN AEROBIC ONLY Blood Culture results may not be optimal due to an inadequate volume of blood received in culture bottles Performed at [facility] [address] [place], [place] [address]    Culture   Final    NO GROWTH < 24 HOURS Performed at [facility] [address], [place],  [address]    Report Status PENDING  Incomplete         Radiology Studies: DG Chest 2 View  Result Date: 06/24/2021 CLINICAL DATA:  Altered level of consciousness.  Hypoxia. EXAM: CHEST - 2 VIEW COMPARISON:  Chest radiographs 03/15/2021 FINDINGS: There is a new large left pleural effusion which appears partially loculated anteriorly. There is mild rightward shift of the mediastinal structures, and the left heart border is obscured. There is associated atelectasis or consolidation in the left mid and lower lung. The right lung is clear. No pneumothorax is identified. No acute osseous abnormality is seen. IMPRESSION: New large left pleural effusion with left lung atelectasis or consolidation. Electronically Signed   By: Logan Bores M.D.   On: 06/21/2021 13:06   CT Head Wo Contrast  Result Date: 06/04/2021 CLINICAL DATA:  Provided history: Mental status change, unknown cause. Additional history provided: Patient found down today. EXAM: CT HEAD WITHOUT CONTRAST TECHNIQUE: Contiguous axial images were obtained from the base of the skull through the vertex without intravenous contrast. RADIATION DOSE REDUCTION: This exam was performed according to the departmental dose-optimization program which includes automated exposure control, adjustment of the mA and/or kV according to patient size and/or use of iterative reconstruction technique. COMPARISON:  Brain MRI 01/27/2021.  Head CT 05/15/2011. FINDINGS: Brain: Mild-to-moderate generalized cerebral atrophy. Comparatively mild cerebellar atrophy. There is no acute intracranial hemorrhage. No demarcated cortical infarct. No extra-axial fluid collection. No evidence of an intracranial mass. No midline shift. Vascular: No hyperdense vessel.  Atherosclerotic calcifications. Skull: Normal.  Negative for fracture or focal lesion. Sinuses/Orbits: Visualized orbits show no acute finding. Small volume frothy secretions within the left sphenoid sinus. IMPRESSION: No evidence of acute intracranial abnormality. Mild-to-moderate generalized cerebral atrophy. Comparatively mild cerebellar atrophy. Mild left sphenoid sinusitis. Electronically Signed   By: Kellie Simmering D.O.   On: 06/05/2021 13:27   CT Angio Chest PE W and/or Wo Contrast  Result Date: 07/01/2021 CLINICAL DATA:  Pulmonary embolism (PE) suspected, high prob EXAM: CT ANGIOGRAPHY CHEST WITH CONTRAST TECHNIQUE: Multidetector CT imaging of the chest was performed using the standard protocol during bolus administration of intravenous contrast. Multiplanar CT image reconstructions and MIPs were obtained to evaluate the vascular anatomy. RADIATION DOSE REDUCTION: This exam was performed according to the departmental dose-optimization program which includes automated exposure control, adjustment of the mA and/or kV according to patient size and/or use of iterative reconstruction technique. CONTRAST:  79mL OMNIPAQUE IOHEXOL 350 MG/ML SOLN COMPARISON:  CT chest August 2022, multiple priors FINDINGS: Cardiovascular: Satisfactory opacification of the pulmonary arteries to the lobar level. Motion artifacts limits evaluation of the more distal pulmonary arteries. No evidence of central pulmonary embolism. There is mediastinal shift to the right. The thoracic aorta is normal in caliber. Normal heart size. No pericardial effusion. Mediastinum/Nodes: Bulky subcarinal lymphadenopathy measuring 3.6 x 3.2 cm. The thyroid gland appears normal. Lungs/Pleura: On the left side, there is complete collapse of the left upper lobe. There is dense consolidation of the inferior left lower lobe with more patchy nodular opacities in the posterior aspect. There appears to be extensive mucous plugging of the airways with abrupt cut off of the left mainstem bronchus. In the right  lung, there is emphysematous changes without appreciable consolidative process, although finer evaluation of the lungs is limited by motion artifact. Musculoskeletal: No aggressive osseous lesions. There is a left breast mass measuring 2.1 x 1.2 cm. Upper abdomen: The visualized upper abdomen is unremarkable. Review of the MIP images confirms the above findings. IMPRESSION: 1. No findings of pulmonary embolism to the level of the lobar pulmonary arteries. Evaluation of the more distal pulmonary arteries limited by motion artifact. 2. There are progressive and extensive changes in the left lung since most recent PET-CT in August 2022 demonstrated a left lower lobe pulmonary mass. There is now complete collapse of the left upper lobe. There is dense consolidation of the inferior left lower lobe along with new nodular opacities in the dependent aspect. There is abrupt cut off of the left mainstem bronchus, with essentially no aeration of the more distal bronchi. Overall, findings are assumed to represent progression of underlying malignancy although a discrete mass is not able to be measured given extensive surrounding consolidation. Superimposed infection/aspiration is a consideration. Extensive plugging of the airways may  be due to a combination of tumor infiltration and/or mucous impaction. 3. Large subcarinal lymphadenopathy, assumed to represent metastatic disease. These results were called by telephone at the time of interpretation on 06/08/2021 at 3:23 pm to provider Dr. Marylyn Ishihara, who verbally acknowledged these results. Electronically Signed   By: Albin Felling M.D.   On: 06/20/2021 15:25        Scheduled Meds:  ALPRAZolam  0.25 mg Oral Daily   brimonidine-timolol  1 drop Both Eyes Q12H   feeding supplement  237 mL Oral BID BM   latanoprost  1 drop Both Eyes QHS   metoprolol tartrate  2.5 mg Intravenous Q8H   oxybutynin  5 mg Oral Daily   Tiotropium Bromide Monohydrate  2 puff Inhalation Daily    Continuous Infusions:   LOS: 1 day    Time spent: 35 minutes    Irine Seal, MD [facility]   To contact the attending provider between 7A-7P or the covering provider during after hours 7P-7A, please log into the web site [email or web address] and access using universal [place] password for that web site. If you do not have the password, please call the hospital operator.  06/23/2021, 4:36 PM

## 2021-06-23 NOTE — Consult Note (Signed)
Chaplain from [facility] completed spiritual consult today @ 16:00   Rev. Santiago Glad Huddelson-Shervon Kerwin, M. Divinity

## 2021-06-23 NOTE — Plan of Care (Signed)
Pt currently resting comfortably in bed, no complaints of pain/discomfort. Family updated at bedside upon admission. Will continue to monitor

## 2021-06-23 NOTE — Progress Notes (Signed)
Nutrition Brief Note  Chart reviewed. Patient triggered Malnutrition Screening Tool (MST).  Pt now transitioning to comfort care.   No nutrition interventions planned at this time.   Please consult if/as needed.   Derrel Nip, RD, LDN (she/her/hers) Clinical Inpatient Dietitian RD Pager/After-Hours/Weekend Pager # in [place]

## 2021-06-23 NOTE — Plan of Care (Signed)
°  Problem: Coping: Goal: Ability to identify and develop effective coping behavior will improve Outcome: Progressing   Problem: Respiratory: Goal: Verbalizations of increased ease of respirations will increase Outcome: Progressing   Problem: Pain Management: Goal: Satisfaction with pain management regimen will improve Outcome: Progressing

## 2021-06-24 DIAGNOSIS — G9341 Metabolic encephalopathy: Secondary | ICD-10-CM | POA: Diagnosis not present

## 2021-06-24 DIAGNOSIS — J9621 Acute and chronic respiratory failure with hypoxia: Secondary | ICD-10-CM | POA: Diagnosis not present

## 2021-06-24 DIAGNOSIS — I4891 Unspecified atrial fibrillation: Secondary | ICD-10-CM | POA: Diagnosis not present

## 2021-06-24 DIAGNOSIS — J9811 Atelectasis: Secondary | ICD-10-CM | POA: Diagnosis not present

## 2021-06-24 DIAGNOSIS — Z515 Encounter for palliative care: Secondary | ICD-10-CM | POA: Diagnosis not present

## 2021-06-24 MED ORDER — METOPROLOL TARTRATE 5 MG/5ML IV SOLN: 2.5000 mg | Freq: Four times a day (QID) | INTRAVENOUS | Status: DC | PRN

## 2021-06-24 MED ORDER — TIMOLOL MALEATE 0.5 % OP SOLN
1.0000 [drp] | Freq: Two times a day (BID) | OPHTHALMIC | Status: DC
  Administered 2021-06-24 (×2): 1 [drp] via OPHTHALMIC
  Filled 2021-06-24: qty 5

## 2021-06-24 MED ORDER — BRIMONIDINE TARTRATE 0.2 % OP SOLN
1.0000 [drp] | Freq: Two times a day (BID) | OPHTHALMIC | Status: DC
  Administered 2021-06-24 (×2): 1 [drp] via OPHTHALMIC
  Filled 2021-06-24: qty 5

## 2021-06-24 NOTE — Progress Notes (Cosign Needed)
0.6mL Morphine sulfate (5mg ) administered. 0.62mL wasted - witness Nonie Hoyer.

## 2021-06-24 NOTE — Consult Note (Signed)
Consultation Note Date: 06/24/2021   Patient Name: Stacey Harmon  DOB: September 04, 1945  MRN: [id number]  Age / Sex: 76 y.o., female  PCP: Stacey Landsman, MD Referring Physician: Eugenie Filler, MD  Reason for Consultation: Establishing goals of care  HPI/Patient Profile: 76 y.o. female   admitted on 06/21/2021    Clinical Assessment and Goals of Care: 76 year old lady with underlying history of hypertension cirrhosis of the liver left lung neoplasm, admitted to hospital medicine service with generalized weakness.  Patient found to have large pleural effusion possible pneumonia CT chest with complete collapse of left lung, progressive neoplastic disease.  Patient seen and evaluated by pulmonary critical care service and decision was made to transition to full comfort measures.  Palliative service consultation has since been requested. Patient is awake alert resting in bed.  Her son Stacey Harmon phone number [phone] is present at the bedside.  I introduced myself and palliative care as follows: Palliative medicine is specialized medical care for people living with serious illness. It focuses on providing relief from the symptoms and stress of a serious illness. The goal is to improve quality of life for both the patient and the family. Goals of care: Broad aims of medical therapy in relation to the patient's values and preferences. Our aim is to provide medical care aimed at enabling patients to achieve the goals that matter most to them, given the circumstances of their particular medical situation and their constraints.  Patient remains very reserved and guarded throughout this initial consultation.  A family meeting was held inside the patient's room along with patient, son Stacey Harmon and her niece on the phone with regards to patient's current condition and further discussion about next step and appropriate disposition  options.  We discussed about her escalating symptom burden and high risk of ongoing decline and debility.  Space philosophy was introduced.  Different levels of hospice care were discussed.  All of the patient's and family's questions answered to the best of my ability.  NEXT OF KIN  Has 3 kids.   SUMMARY OF RECOMMENDATIONS    DNR Agree with current comfort medications Recommend residential hospice: discussed in a family meeting with patient, son Stacey Harmon, niece on the phone.  Thank you for the consult TOC assistance requested.   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 The following aspects are pertinent.  Past Medical History:  Diagnosis Date   Hypertension    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[place])  Social History   Socioeconomic History   Marital status: Divorced    Spouse name: Not on file   Number of children: Not on file   Years of education: Not on file   Highest education level: Not on file  Occupational History   Not on file  Tobacco Use   Smoking status: Former    Packs/day: 0.50    Years: 35.00    Pack years: 17.50    Types: Cigarettes   Smokeless tobacco: Former  Scientific laboratory technician Use: Never used  Substance and Sexual Activity   Alcohol use: Never   Drug use: Never   Sexual activity: Not on file  Other Topics Concern   Not on file  Social History  Narrative   Not on file   Social Determinants of Health   Financial Resource Strain: Not on file  Food Insecurity: Not on file  Transportation Needs: Not on file  Physical Activity: Not on file  Stress: Not on file  Social Connections: Not on file   History reviewed. No pertinent family history. Scheduled Meds:  ALPRAZolam  0.25 mg Oral Daily   brimonidine  1 drop Both Eyes BID   And   timolol  1 drop Both Eyes BID   feeding supplement  237 mL Oral BID BM   latanoprost  1 drop Both Eyes QHS   oxybutynin  5 mg Oral Daily   umeclidinium bromide  1 puff Inhalation Daily   Continuous Infusions: PRN Meds:.acetaminophen **OR** acetaminophen, antiseptic oral rinse, glycopyrrolate **OR** glycopyrrolate **OR** glycopyrrolate, haloperidol **OR** haloperidol **OR** haloperidol lactate, lip balm, LORazepam **OR** LORazepam **OR** LORazepam, metoprolol tartrate, morphine injection, morphine CONCENTRATE **OR** morphine CONCENTRATE, nystatin, ondansetron **OR** ondansetron (ZOFRAN) IV, polyvinyl alcohol Medications Prior to Admission:  Prior to Admission medications   Medication Sig Start Date End Date Taking? Authorizing Provider  albuterol (VENTOLIN HFA) 108 (90 Base) MCG/ACT inhaler Inhale 2 puffs into the lungs every 4 (four) hours as needed for wheezing or shortness of breath. 03/15/21   Collene Gobble, MD  ALPRAZolam Duanne Moron) 0.5 MG tablet Take 0.5 mg by mouth 3 (three) times daily. 07/08/18   [provider]  amLODipine (NORVASC) 10 MG tablet Take 10 mg by mouth daily.      Stacey Landsman, MD  azaTHIOprine (IMURAN) 50 MG tablet Take 150 mg by mouth daily.    Carol Ada, MD  bimatoprost (LUMIGAN) 0.01 % SOLN Place 1 drop into both eyes at bedtime.    [provider]  brimonidine (ALPHAGAN P) 0.1 % SOLN Place 1 drop into both eyes 2 (two) times daily.    [provider]  brimonidine-timolol (COMBIGAN) 0.2-0.5 % ophthalmic solution Place 1 drop into both eyes every 12  (twelve) hours.    [provider]  flavoxATE (URISPAS) 100 MG tablet Take 100 mg by mouth 3 (three) times daily. 05/12/21   [provider]  Multiple Vitamins-Calcium (ONE-A-DAY WOMENS PO) Take 1 tablet by mouth daily.      [provider]  nystatin (MYCOSTATIN/NYSTOP) powder Apply 1 application topically 3 (three) times daily. 11/23/19   [provider]  Omega-3 Fatty Acids (FISH OIL) 1000 MG CAPS Take 1,000 mg by mouth daily.    Stacey Landsman, MD  oxybutynin (DITROPAN) 5 MG tablet Take 5 mg by mouth daily. 04/28/21   [provider]  Polyvinyl Alcohol (LUBRICANT DROPS OP) Place 1 drop into both eyes daily as needed (Dry eyes).    [provider]  predniSONE (DELTASONE) 10 MG tablet Take 10 mg by mouth 3 (three) times daily. 05/08/21   [provider]  predniSONE (DELTASONE) 5 MG tablet Take 5 mg by mouth daily.    Carol Ada, MD  Promethazine HCl 6.25 MG/5ML SOLN Take 5 mLs by mouth every 4 (four) hours as needed for cough. 02/09/21   [provider]  SPIRIVA RESPIMAT 1.25 MCG/ACT AERS INHALE 2 PUFFS INTO THE LUNGS DAILY 06/08/21   Collene Gobble, MD  timolol (TIMOPTIC) 0.5 % ophthalmic solution Place 1 drop into both eyes 2 (two) times daily.    [provider]  Tiotropium Bromide Monohydrate (SPIRIVA RESPIMAT) 1.25 MCG/ACT AERS Inhale 2 puffs into the lungs daily. 03/15/21   Collene Gobble, MD  triamterene-hydrochlorothiazide (MAXZIDE-25) 37.5-25 MG per tablet Take 1 tablet by mouth daily.    [provider]   Allergies  Allergen Reactions   Penicillins Swelling and Other (See Comments)    Reaction: 19 years ago   Review of Systems Complains of pain and shortness of breath. Physical Exam Frail, elderly appearing lady Has cachexia and temporal wasting Absent breath sounds on left Regular work of breathing otherwise S1-S2 Thin extremities, no edema Awake alert oriented Vital Signs: BP 95/60 (BP  Location: Right Arm)    Pulse 91    Temp 97.6 F (36.4 C) (Axillary)    Resp 18    SpO2 97%  Pain Scale: 0-10   Pain Score: 0-No pain   SpO2: SpO2: 97 % O2 Device:SpO2: 97 % O2 Flow Rate: .O2 Flow Rate (L/min): 2 L/min  IO: Intake/output summary:  Intake/Output Summary (Last 24 hours) at 06/24/2021 1040 Last data filed at 06/24/2021 0715 Gross per 24 hour  Intake 300 ml  Output 0 ml  Net 300 ml    LBM: Last BM Date:  (PTA) Baseline Weight:   Most recent weight:       Palliative Assessment/Data:   PPS 30%  Time In: 9.30  Time Out:  10.30 Time Total:  60  Greater than 50%  of this time was spent counseling and coordinating care related to the above assessment and plan.  Signed by: Loistine Chance, MD   Please contact Palliative Medicine Team phone at [phone] for questions and concerns.  For individual provider: See Shea Evans

## 2021-06-24 NOTE — Progress Notes (Signed)
PROGRESS NOTE    Stacey Harmon  ULA:[id number] DOB: 1946/01/31 DOA: 07/04/2021 PCP: Lin Landsman, MD   No chief complaint on file.   Brief Narrative:  Patient 76 year old female history of hypertension, cirrhosis of the liver, left lung neoplasm presented with generalized weakness and noted to be confused on presentation.  It was noted that patient had generally had not been herself for several weeks and family did not know patient carried a diagnosis of likely lung cancer and has been actively hiding it from them.  Patient noted to be found down at home EMS was called brought to the ED with 3 days of worsening weakness and shortness of breath.  Chest x-ray done showed a large pleural effusion and possible pneumonia.  CT angiogram chest done was negative for PE but did show complete collapse of the left lung with progressive neoplastic disease.  PCCM consulted and decision was made to transition to full comfort measures.   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 -Patient presenting with some shortness of breath, noted to be hypoxic, chest x-ray done concerning for large left pleural effusion. -CT angiogram chest done negative for PE however did show progressive and extensive changes to the left lung with left lower lobe pulmonary mass with complete collapse of left upper lobe and dense consolidation inferior left lower lobe with new nodular opacities and obstruction of  left mainstem bronchus.  Large subcarinal lymphadenopathy noted to represent metastatic disease. -Patient seen in consultation by PCCM who reviewed images discussed options with family about pursuing aggressive treatment which patient did not want in the past versus focusing on comfort which family opted for focusing on comfort as Patient to be comfortable in the final days/weeks. -It was felt per PCCM that thoracentesis unlikely to provide any symptomatic benefit in the setting of occlusive left mainstem tumor. -Palliative care consulted to help with symptom management, goals of care, transitioning to probable residential versus home hospice. -Continue morphine as needed. -Palliative care consultation pending.  2.  Acute metabolic encephalopathy -Likely secondary to problem #1. -Patient now full comfort measures.  3.  History of cirrhosis/hypertension -Blood pressure borderline.  Discontinue scheduled IV Lopressor.  4.  Atrial fibrillation -Noted on EKG. -Patient with no prior history of A. fib. -Likely secondary to problem #1. -Patient on comfort measures. -Blood pressure borderline and as such discontinue scheduled IV Lopressor. -Not a candidate for anticoagulation as patient being transitioned to full comfort measures.  5.  Severe protein calorie malnutrition -Secondary to problem #1. -Patient transitioned to full comfort measures.   6.  Dehydration -Patient seen by PCCM and due to extent of disease have elected to go with comfort care measures. -Received bag of IV fluids which have subsequently been discontinued.     DVT prophylaxis: Comfort care. Code Status: DNR Family Communication: Updated patient, son at bedside. Disposition:   Status is: Inpatient  Remains inpatient appropriate because: Severity of illness       Consultants:  PCCM: Dr. Tamala Julian 06/04/2021 Palliative care pending.   Procedures: CT angiogram chest 06/10/2021 CT head without contrast  06/16/2021 Chest x-ray 06/23/2021  Antimicrobials:  None   Subjective: Sitting up in bed, seems slightly somewhat short of breath.  Denies any chest pain.  No abdominal pain.  Son at bedside.    Objective: Vitals:   06/23/21 2059 06/24/21 0152 06/24/21 0633 06/24/21 0700  BP: 108/63 113/71 103/73 95/60  Pulse: 95 97 86 91  Resp: 16 16 20 18   Temp: 98.6 F (37 C) 98 F (36.7 C) 98.6 F (37 C) 97.6 F (36.4 C)  TempSrc: Oral Oral Oral Axillary  SpO2: 100% 96% 95% 97%    Intake/Output Summary (Last 24 hours) at 06/24/2021 1013 Last data filed at 06/24/2021 0715 Gross per 24 hour  Intake 300 ml  Output 0 ml  Net 300 ml    There were no vitals filed for this visit.  Examination:  General exam: Frail.  Temporal wasting.  Respiratory system: Absent breath sounds on the left.  Some tachypnea.   Cardiovascular system: Tachycardia.  No JVD, no murmurs rubs or gallops.  No lower extremity edema.  Gastrointestinal system: Abdomen is soft, nontender, nondistended, positive bowel sounds.  No rebound.  No guarding. Central nervous system: Alert and oriented. No focal neurological deficits. Extremities: Symmetric 5 x 5 power. Skin: No rashes, lesions or ulcers Psychiatry: Judgement and insight appear normal. Mood & affect appropriate.     Data Reviewed: I have personally reviewed following labs and imaging studies  CBC: Recent Labs  Lab 06/29/2021 1211  WBC 19.6*  NEUTROABS 17.3*  HGB 11.9*  HCT 35.6*  MCV 97.0  PLT 464*     Basic Metabolic Panel: Recent Labs  Lab 06/04/2021 1151  NA 135  K 3.8  CL 98  CO2 27  GLUCOSE 103*  BUN 40*  CREATININE 0.76  CALCIUM 13.0*     GFR: CrCl cannot be calculated (Unknown ideal weight.).  Liver Function Tests: Recent Labs  Lab 06/18/2021 1151  AST 23  ALT 14  ALKPHOS 69  BILITOT 1.4*  PROT 6.4*  ALBUMIN 2.5*     CBG: Recent Labs  Lab 06/25/2021 1156  GLUCAP 107*      Recent Results (from the past 240  hour(s))  Resp Panel by RT-PCR (Flu A&B, Covid) Nasopharyngeal Swab     Status: None   Collection Time: 06/14/2021 12:49 PM   Specimen: Nasopharyngeal Swab; Nasopharyngeal(NP) swabs in vial transport medium  Result Value Ref Range Status   SARS Coronavirus 2 by RT PCR NEGATIVE NEGATIVE Final    Comment: (NOTE) SARS-CoV-2 target nucleic acids are NOT DETECTED.  The SARS-CoV-2 RNA is generally detectable in upper respiratory specimens during the acute phase of infection. The lowest concentration of SARS-CoV-2 viral copies this assay can detect is 138 copies/mL. A negative result does not preclude SARS-Cov-2 infection and should not be used as the sole basis for treatment or other patient management decisions. A negative result may occur with  improper specimen collection/handling, submission of specimen other than nasopharyngeal swab, presence of viral mutation(s) within the areas targeted by this assay, and inadequate number of viral copies(<138 copies/mL). A negative result must be combined with clinical observations, patient history, and epidemiological information. The expected result is Negative.  Fact Sheet for Patients:  EntrepreneurPulse.com.au  Fact Sheet for Healthcare Providers:  IncredibleEmployment.be  This test is no t yet approved or cleared by the [place] and  has been authorized  for detection and/or diagnosis of SARS-CoV-2 by FDA under an Emergency Use Authorization (EUA). This EUA will remain  in effect (meaning this test can be used) for the duration of the COVID-19 declaration under Section 564(b)(1) of the Act, 21 U.S.C.section 360bbb-3(b)(1), unless the authorization is terminated  or revoked sooner.       Influenza A by PCR NEGATIVE NEGATIVE Final   Influenza B by PCR NEGATIVE NEGATIVE Final    Comment: (NOTE) The Xpert Xpress SARS-CoV-2/FLU/RSV plus assay is intended as an aid in the diagnosis of influenza from  Nasopharyngeal swab specimens and should not be used as a sole basis for treatment. Nasal washings and aspirates are unacceptable for Xpert Xpress SARS-CoV-2/FLU/RSV testing.  Fact Sheet for Patients: EntrepreneurPulse.com.au  Fact Sheet for Healthcare Providers: IncredibleEmployment.be  This test is not yet approved or cleared by the [place] FDA and has been authorized for detection and/or diagnosis of SARS-CoV-2 by FDA under an Emergency Use Authorization (EUA). This EUA will remain in effect (meaning this test can be used) for the duration of the COVID-19 declaration under Section 564(b)(1) of the Act, 21 U.S.C. section 360bbb-3(b)(1), unless the authorization is terminated or revoked.  Performed at [facility], [place] [address], [place], [place] [address]   Culture, blood (routine x 2)     Status: None (Preliminary result)   Collection Time: 06/11/2021  1:56 PM   Specimen: Right Antecubital; Blood  Result Value Ref Range Status   Specimen Description   Final    RIGHT ANTECUBITAL Performed at [facility] [address] [place], [place] [address]    Special Requests   Final    BOTTLES DRAWN AEROBIC AND ANAEROBIC Blood Culture results may not be optimal due to an excessive volume of blood received in culture bottles Performed at [facility] [address] [place], [place] [address]    Culture   Final    NO GROWTH 2 DAYS Performed at [facility] [address], [place], [place] [address]    Report Status PENDING  Incomplete  Culture, blood (routine x 2)     Status: None (Preliminary result)   Collection Time: 07/04/2021  1:56 PM   Specimen: BLOOD  Result Value Ref Range Status   Specimen Description   Final    BLOOD LEFT ANTECUBITAL Performed at [facility] [address], [place], [place] [address]    Special Requests   Final    BOTTLES DRAWN  AEROBIC ONLY Blood Culture results may not be optimal due to an inadequate volume of blood received in culture bottles Performed at [facility] [address], [place], [place] [address]    Culture   Final    NO GROWTH 2 DAYS Performed at [facility] [address] [place], [place] [address]    Report Status PENDING  Incomplete          Radiology Studies: DG Chest 2 View  Result Date: 06/19/2021 CLINICAL DATA:  Altered level of consciousness.  Hypoxia. EXAM: CHEST - 2 VIEW COMPARISON:  Chest radiographs 03/15/2021 FINDINGS: There is a new large left pleural effusion which appears partially loculated anteriorly. There is mild rightward shift of the mediastinal structures, and the left heart border is obscured. There is associated atelectasis or consolidation in the left mid and lower lung. The right lung is clear. No pneumothorax is identified. No acute osseous abnormality is seen. IMPRESSION: New large left pleural  effusion with left lung atelectasis or consolidation. Electronically Signed   By: Logan Bores M.D.   On: 06/27/2021 13:06   CT Head Wo Contrast  Result Date: 06/28/2021 CLINICAL DATA:  Provided history: Mental status change, unknown cause. Additional history provided: Patient found down today. EXAM: CT HEAD WITHOUT CONTRAST TECHNIQUE: Contiguous axial images were obtained from the base of the skull through the vertex without intravenous contrast. RADIATION DOSE REDUCTION: This exam was performed according to the departmental dose-optimization program which includes automated exposure control, adjustment of the mA and/or kV according to patient size and/or use of iterative reconstruction technique. COMPARISON:  Brain MRI 01/27/2021.  Head CT 05/15/2011. FINDINGS: Brain: Mild-to-moderate generalized cerebral atrophy. Comparatively mild cerebellar atrophy. There is no acute intracranial hemorrhage. No demarcated cortical infarct. No extra-axial fluid  collection. No evidence of an intracranial mass. No midline shift. Vascular: No hyperdense vessel.  Atherosclerotic calcifications. Skull: Normal. Negative for fracture or focal lesion. Sinuses/Orbits: Visualized orbits show no acute finding. Small volume frothy secretions within the left sphenoid sinus. IMPRESSION: No evidence of acute intracranial abnormality. Mild-to-moderate generalized cerebral atrophy. Comparatively mild cerebellar atrophy. Mild left sphenoid sinusitis. Electronically Signed   By: Kellie Simmering D.O.   On: 06/21/2021 13:27   CT Angio Chest PE W and/or Wo Contrast  Result Date: 06/08/2021 CLINICAL DATA:  Pulmonary embolism (PE) suspected, high prob EXAM: CT ANGIOGRAPHY CHEST WITH CONTRAST TECHNIQUE: Multidetector CT imaging of the chest was performed using the standard protocol during bolus administration of intravenous contrast. Multiplanar CT image reconstructions and MIPs were obtained to evaluate the vascular anatomy. RADIATION DOSE REDUCTION: This exam was performed according to the departmental dose-optimization program which includes automated exposure control, adjustment of the mA and/or kV according to patient size and/or use of iterative reconstruction technique. CONTRAST:  75mL OMNIPAQUE IOHEXOL 350 MG/ML SOLN COMPARISON:  CT chest August 2022, multiple priors FINDINGS: Cardiovascular: Satisfactory opacification of the pulmonary arteries to the lobar level. Motion artifacts limits evaluation of the more distal pulmonary arteries. No evidence of central pulmonary embolism. There is mediastinal shift to the right. The thoracic aorta is normal in caliber. Normal heart size. No pericardial effusion. Mediastinum/Nodes: Bulky subcarinal lymphadenopathy measuring 3.6 x 3.2 cm. The thyroid gland appears normal. Lungs/Pleura: On the left side, there is complete collapse of the left upper lobe. There is dense consolidation of the inferior left lower lobe with more patchy nodular opacities in  the posterior aspect. There appears to be extensive mucous plugging of the airways with abrupt cut off of the left mainstem bronchus. In the right lung, there is emphysematous changes without appreciable consolidative process, although finer evaluation of the lungs is limited by motion artifact. Musculoskeletal: No aggressive osseous lesions. There is a left breast mass measuring 2.1 x 1.2 cm. Upper abdomen: The visualized upper abdomen is unremarkable. Review of the MIP images confirms the above findings. IMPRESSION: 1. No findings of pulmonary embolism to the level of the lobar pulmonary arteries. Evaluation of the more distal pulmonary arteries limited by motion artifact. 2. There are progressive and extensive changes in the left lung since most recent PET-CT in August 2022 demonstrated a left lower lobe pulmonary mass. There is now complete collapse of the left upper lobe. There is dense consolidation of the inferior left lower lobe along with new nodular opacities in the dependent aspect. There is abrupt cut off of the left mainstem bronchus, with essentially no aeration of the more distal bronchi. Overall, findings are assumed to represent progression  of underlying malignancy although a discrete mass is not able to be measured given extensive surrounding consolidation. Superimposed infection/aspiration is a consideration. Extensive plugging of the airways may be due to a combination of tumor infiltration and/or mucous impaction. 3. Large subcarinal lymphadenopathy, assumed to represent metastatic disease. These results were called by telephone at the time of interpretation on 06/16/2021 at 3:23 pm to provider Dr. Marylyn Ishihara, who verbally acknowledged these results. Electronically Signed   By: Albin Felling M.D.   On: 06/16/2021 15:25        Scheduled Meds:  ALPRAZolam  0.25 mg Oral Daily   brimonidine  1 drop Both Eyes BID   And   timolol  1 drop Both Eyes BID   feeding supplement  237 mL Oral BID BM    latanoprost  1 drop Both Eyes QHS   oxybutynin  5 mg Oral Daily   umeclidinium bromide  1 puff Inhalation Daily   Continuous Infusions:   LOS: 2 days    Time spent: 35 minutes    Irine Seal, MD [facility]   To contact the attending provider between 7A-7P or the covering provider during after hours 7P-7A, please log into the web site [email or web address] and access using universal [place] password for that web site. If you do not have the password, please call the hospital operator.  06/24/2021, 10:13 AM

## 2021-06-25 DIAGNOSIS — J9621 Acute and chronic respiratory failure with hypoxia: Secondary | ICD-10-CM | POA: Diagnosis not present

## 2021-06-25 DIAGNOSIS — I4891 Unspecified atrial fibrillation: Secondary | ICD-10-CM | POA: Diagnosis not present

## 2021-06-25 DIAGNOSIS — G9341 Metabolic encephalopathy: Secondary | ICD-10-CM | POA: Diagnosis not present

## 2021-06-25 DIAGNOSIS — J9811 Atelectasis: Secondary | ICD-10-CM | POA: Diagnosis not present

## 2021-06-25 MED ORDER — MORPHINE SULFATE (PF) 2 MG/ML IV SOLN
1.0000 mg | INTRAVENOUS | Status: DC
  Administered 2021-06-25 (×3): 1 mg via INTRAVENOUS
  Filled 2021-06-25 (×2): qty 1

## 2021-06-25 NOTE — Progress Notes (Signed)
PROGRESS NOTE    Stacey Harmon  UXL:[id number] DOB: 1945-10-27 DOA: 06/23/2021 PCP: Lin Landsman, MD   No chief complaint on file.   Brief Narrative:  Patient 76 year old female history of hypertension, cirrhosis of the liver, left lung neoplasm presented with generalized weakness and noted to be confused on presentation.  It was noted that patient had generally had not been herself for several weeks and family did not know patient carried a diagnosis of likely lung cancer and has been actively hiding it from them.  Patient noted to be found down at home EMS was called brought to the ED with 3 days of worsening weakness and shortness of breath.  Chest x-ray done showed a large pleural effusion and possible pneumonia.  CT angiogram chest done was negative for PE but did show complete collapse of the left lung with progressive neoplastic disease.  PCCM consulted and decision was made to transition to full comfort measures.   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 -Patient presenting with some shortness of breath, noted to be hypoxic, chest x-ray done concerning for large left pleural effusion. -CT angiogram chest done negative for PE however did show progressive and extensive changes to the left lung with left lower lobe pulmonary mass with complete collapse of left upper lobe and dense consolidation inferior left lower lobe with new nodular opacities and obstruction of  left mainstem bronchus.  Large subcarinal lymphadenopathy noted to represent metastatic disease. -Patient seen in consultation by PCCM who reviewed images discussed options with family about pursuing aggressive treatment which patient did not want in the past versus focusing on comfort which family opted for focusing on comfort as Patient to be comfortable in the final days/weeks. -It was felt per PCCM that thoracentesis unlikely to provide any symptomatic benefit in the setting of occlusive left mainstem tumor. -Palliative care consulted to help with symptom management, goals of care, transitioning to probable residential versus home hospice. -Continue morphine as needed. -Patient to be placed on scheduled morphine per palliative care. -Likely in-hospital death. -Palliative care following.  2.  Acute metabolic encephalopathy -Likely secondary to problem #1. -Patient now full comfort measures.  3.  History of cirrhosis/hypertension -Blood pressure borderline.   -Scheduled IV Lopressor discontinued.   4.  Atrial fibrillation -Noted on EKG. -Patient with no prior history of A. fib. -Likely secondary to problem #1. -Patient on comfort measures. -Blood pressure borderline and as such discontinue scheduled IV Lopressor. -Not a candidate for anticoagulation as patient transitioned to full comfort measures.  5.  Severe protein calorie malnutrition -Secondary to problem #1. -Patient transitioned to full comfort measures.   6.  Dehydration -Patient seen by PCCM and due to extent of disease have elected to go with comfort care measures. -Received bag of IV fluids which have subsequently been discontinued.  7.  Disposition -Patient with clinical deterioration over the past 24 hours.  Likely in-hospital death. -Currently on comfort care. -Palliative care following.   DVT  prophylaxis: Comfort care. Code Status: DNR Family Communication: Updated patient, sons at bedside. Disposition:    Status is: Inpatient  Remains inpatient appropriate because: Severity of illness       Consultants:  PCCM: Dr. Tamala Julian 06/13/2021 Palliative care: Dr. Rowe Pavy 06/24/2021   Procedures: CT angiogram chest 06/09/2021 CT head without contrast 06/08/2021 Chest x-ray 07/02/2021  Antimicrobials:  None   Subjective: Patient minimally responsive.  Resting comfortably.  Occasional flailing of arms.  Sons at bedside.     Objective: Vitals:   06/24/21 0633 06/24/21 0700 06/24/21 1300 06/25/21 0553  BP: 103/73 95/60  106/65  Pulse: 86 91  (!) 107  Resp: 20 18  16   Temp: 98.6 F (37 C) 97.6 F (36.4 C)  98 F (36.7 C)  TempSrc: Oral Axillary  Oral  SpO2: 95% 97%  98%  Weight:   63 kg   Height:   5\' 3"  (1.6 m)     Intake/Output Summary (Last 24 hours) at 06/25/2021 1036 Last data filed at 06/25/2021 0600 Gross per 24 hour  Intake 120 ml  Output 300 ml  Net -180 ml    Filed Weights   06/24/21 1300  Weight: 63 kg    Examination:  General exam: Minimally responsive.  Frail.  Temporal wasting. Respiratory system: Absent breath sounds on the left.  Cardiovascular system: RRR no murmurs rubs or gallops.  No JVD.  No lower extremity edema. Gastrointestinal system: Abdomen soft, nontender, nondistended, positive bowel sounds.  No rebound.  No guarding. Central nervous system: Minimally responsive.  Moving extremities spontaneously.  No focal neurological deficits. Extremities: Symmetric 5 x 5 power. Skin: No rashes, lesions or ulcers Psychiatry: Judgement and insight appear normal. Mood & affect appropriate.     Data Reviewed: I have personally reviewed following labs and imaging studies  CBC: Recent Labs  Lab 06/16/2021 1211  WBC 19.6*  NEUTROABS 17.3*  HGB 11.9*  HCT 35.6*  MCV 97.0  PLT 464*     Basic Metabolic Panel: Recent Labs  Lab 06/19/2021 1151  NA 135  K 3.8  CL 98  CO2 27  GLUCOSE 103*  BUN 40*  CREATININE 0.76  CALCIUM 13.0*      GFR: Estimated Creatinine Clearance: 55.1 mL/min (by C-G formula based on SCr of 0.76 mg/dL).  Liver Function Tests: Recent Labs  Lab 06/12/2021 1151  AST 23  ALT 14  ALKPHOS 69  BILITOT 1.4*  PROT 6.4*  ALBUMIN 2.5*     CBG: Recent Labs  Lab 06/20/2021 1156  GLUCAP 107*      Recent Results (from the past 240 hour(s))  Resp Panel by RT-PCR (Flu A&B, Covid) Nasopharyngeal Swab     Status: None   Collection Time: 06/25/2021 12:49 PM   Specimen: Nasopharyngeal Swab; Nasopharyngeal(NP) swabs in vial transport medium  Result Value Ref Range Status   SARS Coronavirus 2 by RT PCR NEGATIVE NEGATIVE Final    Comment: (NOTE) SARS-CoV-2 target nucleic acids are NOT DETECTED.  The SARS-CoV-2 RNA is generally detectable in upper respiratory specimens during the acute phase of infection. The lowest concentration of SARS-CoV-2 viral copies this assay can detect is 138 copies/mL. A negative result does not preclude SARS-Cov-2 infection and should not be used as the sole basis for treatment or other patient management decisions. A negative result may occur with  improper specimen collection/handling, submission of specimen other than nasopharyngeal swab, presence of viral mutation(s) within the areas targeted by this assay, and inadequate number of viral copies(<138  copies/mL). A negative result must be combined with clinical observations, patient history, and epidemiological information. The expected result is Negative.  Fact Sheet for Patients:  EntrepreneurPulse.com.au  Fact Sheet for Healthcare Providers:  IncredibleEmployment.be  This test is no t yet approved or cleared by the [place] FDA and  has been authorized for detection and/or diagnosis of SARS-CoV-2 by FDA under an Emergency Use Authorization (EUA). This EUA will remain  in effect (meaning this test can be used) for the duration of the COVID-19 declaration under Section  564(b)(1) of the Act, 21 U.S.C.section 360bbb-3(b)(1), unless the authorization is terminated  or revoked sooner.       Influenza A by PCR NEGATIVE NEGATIVE Final   Influenza B by PCR NEGATIVE NEGATIVE Final    Comment: (NOTE) The Xpert Xpress SARS-CoV-2/FLU/RSV plus assay is intended as an aid in the diagnosis of influenza from Nasopharyngeal swab specimens and should not be used as a sole basis for treatment. Nasal washings and aspirates are unacceptable for Xpert Xpress SARS-CoV-2/FLU/RSV testing.  Fact Sheet for Patients: EntrepreneurPulse.com.au  Fact Sheet for Healthcare Providers: IncredibleEmployment.be  This test is not yet approved or cleared by the [place] FDA and has been authorized for detection and/or diagnosis of SARS-CoV-2 by FDA under an Emergency Use Authorization (EUA). This EUA will remain in effect (meaning this test can be used) for the duration of the COVID-19 declaration under Section 564(b)(1) of the Act, 21 U.S.C. section 360bbb-3(b)(1), unless the authorization is terminated or revoked.  Performed at [facility], [place] [address], [place], [place] [address]   Culture, blood (routine x 2)     Status: None (Preliminary result)   Collection Time: 06/21/2021  1:56 PM   Specimen: Right Antecubital; Blood  Result Value Ref Range Status   Specimen Description   Final    RIGHT ANTECUBITAL Performed at [facility] [address], [place], [place] [address]    Special Requests   Final    BOTTLES DRAWN AEROBIC AND ANAEROBIC Blood Culture results may not be optimal due to an excessive volume of blood received in culture bottles Performed at [facility] [address], [place], [place] [address]    Culture   Final    NO GROWTH 3 DAYS Performed at [facility], [place] [address], [place], [place] [address]    Report Status PENDING  Incomplete  Culture,  blood (routine x 2)     Status: None (Preliminary result)   Collection Time: 06/15/2021  1:56 PM   Specimen: BLOOD  Result Value Ref Range Status   Specimen Description   Final    BLOOD LEFT ANTECUBITAL Performed at [facility] [address], [place], [place] [address]    Special Requests   Final    BOTTLES DRAWN AEROBIC ONLY Blood Culture results may not be optimal due to an inadequate volume of blood received in culture bottles Performed at [facility] [address] [place],  [address]    Culture   Final    NO GROWTH 3 DAYS Performed at [facility], [place] [address], [place],  [address]    Report Status PENDING  Incomplete          Radiology Studies: No results found.      Scheduled Meds:  ALPRAZolam  0.25 mg Oral Daily   brimonidine  1 drop Both Eyes BID   And   timolol  1 drop  Both Eyes BID   feeding supplement  237 mL Oral BID BM   latanoprost  1 drop Both Eyes QHS   oxybutynin  5 mg Oral Daily   umeclidinium bromide  1 puff Inhalation Daily   Continuous Infusions:   LOS: 3 days    Time spent: 35 minutes    Irine Seal, MD [facility]   To contact the attending provider between 7A-7P or the covering provider during after hours 7P-7A, please log into the web site [email or web address] and access using universal [place] password for that web site. If you do not have the password, please call the hospital operator.  06/25/2021, 10:36 AM

## 2021-06-25 NOTE — Progress Notes (Signed)
Daily Progress Note   Patient Name: Stacey Harmon       Date: 06/25/2021 DOB: Nov 02, 1945  Age: 76 y.o. MRN#: [id number] Attending Physician: Eugenie Filler, MD Primary Care Physician: Lin Landsman, MD Admit Date: 06/19/2021  Reason for Consultation/Follow-up: Establishing goals of care, Non pain symptom management, Pain control, and Terminal Care  Subjective:  Not awake not alert, mouth open, eyes closed, not responsive, family at bedside, we talked about end of life signs and symptoms, d/c supplemental O2 and anticipated hospital death, see below.   Length of Stay: 3  Current Medications: Scheduled Meds:   brimonidine  1 drop Both Eyes BID   And   timolol  1 drop Both Eyes BID   feeding supplement  237 mL Oral BID BM   latanoprost  1 drop Both Eyes QHS    morphine injection  1 mg Intravenous Q4H   oxybutynin  5 mg Oral Daily   umeclidinium bromide  1 puff Inhalation Daily    Continuous Infusions:   PRN Meds: acetaminophen **OR** acetaminophen, antiseptic oral rinse, glycopyrrolate **OR** glycopyrrolate **OR** glycopyrrolate, haloperidol **OR** haloperidol **OR** haloperidol lactate, lip balm, LORazepam **OR** LORazepam **OR** LORazepam, metoprolol tartrate, morphine injection, morphine CONCENTRATE **OR** morphine CONCENTRATE, nystatin, ondansetron **OR** ondansetron (ZOFRAN) IV, polyvinyl alcohol  Physical Exam         Unresponsive today Frail, elderly appearing lady Has cachexia and temporal wasting Absent breath sounds on left Shallow breath sounds, family has also noted apneic spells S1-S2 Thin extremities, no edema Flailing upper extremities periodically  Vital Signs: BP 106/65 (BP Location: Left Arm)    Pulse (!) 107    Temp 98 F (36.7 C) (Oral)    Resp 16     Ht 5\' 3"  (1.6 m)    Wt 63 kg    SpO2 98%    BMI 24.60 kg/m  SpO2: SpO2: 98 % O2 Device: O2 Device: Room Air O2 Flow Rate: O2 Flow Rate (L/min): 2 L/min  Intake/output summary:  Intake/Output Summary (Last 24 hours) at 06/25/2021 1050 Last data filed at 06/25/2021 0600 Gross per 24 hour  Intake 120 ml  Output 300 ml  Net -180 ml   LBM: Last BM Date:  (PTA) Baseline Weight: Weight: 63 kg Most recent weight: Weight: 63 kg  Palliative Assessment/Data:      Patient Active Problem List   Diagnosis Date Noted   Pressure injury of skin 06/23/2021   Acute on chronic respiratory failure with hypoxia ([place]) 06/23/2021   Collapse of left lung 06/23/2021   Dehydration 32/95/1884   Acute metabolic encephalopathy 16/60/6301   Atrial fibrillation with RVR (HCC)    Pleural effusion on left    PNA (pneumonia) 07/02/2021   Multifocal pneumonia 06/19/2021   Dyspnea 03/15/2021   Encounter for antineoplastic chemotherapy 03/07/2021   Malignant neoplasm of lower lobe of left lung ([place]) 01/12/2021   Primary biliary cirrhosis ([place]) 01/10/2018   Essential hypertension 01/10/2018   Glaucoma 01/10/2018    Palliative Care Assessment & Plan   Patient Profile:  76 year old lady with underlying history of hypertension cirrhosis of the liver left lung neoplasm, admitted to hospital medicine service with generalized weakness.  Patient found to have large pleural effusion possible pneumonia CT chest with complete collapse of left lung, progressive neoplastic disease.  Patient seen and evaluated by pulmonary critical care service and decision was made to transition to full comfort measures.  Palliative service consultation has since been requested.  Assessment:  Actively dying Dyspnea Generalized distress  Recommendations/Plan:  Comfort measures Chaplain consult Anticipate hospital death, do not recommend transfer to residential hospice Scheduled IV Morphine, in addition to  current comfort measures Ok to d/c supplemental O2.   Goals of Care and Additional Recommendations: Limitations on Scope of Treatment: Full Comfort Care  Code Status:    Code Status Orders  (From admission, onward)           Start     Ordered   06/27/2021 1527  Do not attempt resuscitation (DNR)  Continuous       Question Answer Comment  In the event of cardiac or respiratory ARREST Do not call a code blue   In the event of cardiac or respiratory ARREST Do not perform Intubation, CPR, defibrillation or ACLS   In the event of cardiac or respiratory ARREST Use medication by any route, position, wound care, and other measures to relive pain and suffering. May use oxygen, suction and manual treatment of airway obstruction as needed for comfort.   Comments confirmed with pt and family at bedside      07/03/2021 1528           Code Status History     This patient has a current code status but no historical code status.       Prognosis:  Hours - Days  Discharge Planning: Anticipated Hospital Death  Care plan was discussed with  patient's family at bedside, [facility] MD Dr [place]ville Silos.   Thank you for allowing the Palliative Medicine Team to assist in the care of this patient.   Time In: 10 Time Out: 10.35 Total Time 35 Prolonged Time Billed  no       Greater than 50%  of this time was spent counseling and coordinating care related to the above assessment and plan.  Loistine Chance, MD  Please contact Palliative Medicine Team phone at [phone] for questions and concerns.

## 2021-06-27 LAB — CULTURE, BLOOD (ROUTINE X 2)
Culture: NO GROWTH
Culture: NO GROWTH

## 2021-07-05 NOTE — Death Summary Note (Signed)
DEATH SUMMARY   Patient Details  Name: Stacey Harmon MRN: [id number] DOB: November 28, 1945  Admission/Discharge Information   Admit Date:  07-11-2021  Date of Death: Date of Death: July 15, 2021  Time of Death: Time of Death: 2022-08-31  Length of Stay: 4  Referring Physician: Lin Landsman, MD   Reason(s) for Hospitalization  Generalized weakness, worsening shortness of breath  Diagnoses  Preliminary cause of death:  Secondary Diagnoses (including complications and co-morbidities):  Principal Problem:   Acute on chronic respiratory failure with hypoxia ([place]) Active Problems:   Collapse of left lung   Primary biliary cirrhosis ([place])   Essential hypertension   PNA (pneumonia)   Multifocal pneumonia   Pressure injury of skin   Dehydration   Acute metabolic encephalopathy   Atrial fibrillation with RVR ([place])   Pleural effusion on left   Brief Hospital Course (including significant findings, care, treatment, and services provided and events leading to death)  Stacey Harmon is a 76 y.o. year old female history of hypertension, cirrhosis of the liver, left lung neoplasm presented with generalized weakness and noted to be confused on presentation.  It was noted that patient had generally had not been herself for several weeks and family did not know patient carried a diagnosis of likely lung cancer and has been actively hiding it from them.  Patient noted to be found down at home EMS was called brought to the ED with 3 days of worsening weakness and shortness of breath.  Chest x-ray done showed a large pleural effusion and possible pneumonia.  CT angiogram chest done was negative for PE but did show complete collapse of the left lung with progressive neoplastic disease.  PCCM consulted and decision was made to transition to full comfort measures.  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 -Patient presented with shortness of breath, noted to be hypoxic, chest x-ray done concerning for large left pleural effusion. -CT angiogram chest done negative for PE however did show progressive and extensive changes to the left lung with left lower lobe pulmonary mass with complete collapse of left upper lobe and dense consolidation inferior left lower lobe with new nodular opacities and obstruction of left mainstem bronchus.  Large subcarinal lymphadenopathy noted to represent metastatic disease. -Patient seen in consultation by PCCM who reviewed images discussed options with family about pursuing aggressive treatment which patient did not want in the past versus focusing on comfort which family opted for focusing on comfort as Patient to be comfortable in the final days/weeks. -It was felt per PCCM that thoracentesis unlikely to provide any symptomatic benefit in the setting of occlusive left mainstem tumor. -Palliative care consulted to help with symptom management, goals of care, transitioning to probable residential versus home hospice. -Patient was transition to full comfort measures, initially placed on morphine as needed. -Patient however deteriorated within 24 hours, noted to be actively dying, subsequently placed on scheduled morphine and kept comfortable. -Patient subsequently died on 07/15/21 at 0010 hours. -May her soul rest in peace.  -2.  Acute metabolic encephalopathy -Likely secondary to problem #1. -Patient was placed on full comfort measures.    3.  History of cirrhosis/hypertension -Blood pressure borderline.   -Scheduled IV Lopressor discontinued.  -Patient was placed on full comfort measures.  4.  Atrial fibrillation -Noted on EKG. -Patient with no prior history of A. fib. -Likely secondary to problem #1. -  Patient initially placed on scheduled IV Lopressor however due to borderline blood pressure IV Lopressor was  discontinued. -Not a candidate for anticoagulation as patient transitioned to full comfort measures. -Patient was subsequently transitioned to full comfort measures. -Patient seen and followed by palliative care.  Patient was placed on a morphine drip and kept comfortable and subsequently died on Jul 26, 2021 at 0010 hours.   5.  Severe protein calorie malnutrition -Secondary to problem #1. -Patient transitioned to full comfort measures.   6.  Dehydration -Patient seen by PCCM and due to extent of disease elected to go with comfort care measures. -Received bag of IV fluids which were subsequently discontinued.  7.  Disposition -Patient with clinical deterioration during the hospitalization.   -Patient placed on full comfort measures.   -Palliative care assessed the patient and due to significant deterioration, patient noted to be actively dying, patient placed on a morphine drip and was kept comfortable.   -Patient subsequently died on 07/26/21 at 0010 hours.  -May her soul rest in peace.     Pertinent Labs and Studies  Significant Diagnostic Studies DG Chest 2 View  Result Date: 06/29/2021 CLINICAL DATA:  Altered level of consciousness.  Hypoxia. EXAM: CHEST - 2 VIEW COMPARISON:  Chest radiographs 03/15/2021 FINDINGS: There is a new large left pleural effusion which appears partially loculated anteriorly. There is mild rightward shift of the mediastinal structures, and the left heart border is obscured. There is associated atelectasis or consolidation in the left mid and lower lung. The right lung is clear. No pneumothorax is identified. No acute osseous abnormality is seen. IMPRESSION: New large left pleural effusion with left lung atelectasis or consolidation. Electronically Signed   By: Logan Bores M.D.   On: 06/27/2021 13:06   CT Head Wo Contrast  Result Date: 06/19/2021 CLINICAL DATA:  Provided history: Mental status change, unknown cause. Additional history provided: Patient found  down today. EXAM: CT HEAD WITHOUT CONTRAST TECHNIQUE: Contiguous axial images were obtained from the base of the skull through the vertex without intravenous contrast. RADIATION DOSE REDUCTION: This exam was performed according to the departmental dose-optimization program which includes automated exposure control, adjustment of the mA and/or kV according to patient size and/or use of iterative reconstruction technique. COMPARISON:  Brain MRI 01/27/2021.  Head CT 05/15/2011. FINDINGS: Brain: Mild-to-moderate generalized cerebral atrophy. Comparatively mild cerebellar atrophy. There is no acute intracranial hemorrhage. No demarcated cortical infarct. No extra-axial fluid collection. No evidence of an intracranial mass. No midline shift. Vascular: No hyperdense vessel.  Atherosclerotic calcifications. Skull: Normal. Negative for fracture or focal lesion. Sinuses/Orbits: Visualized orbits show no acute finding. Small volume frothy secretions within the left sphenoid sinus. IMPRESSION: No evidence of acute intracranial abnormality. Mild-to-moderate generalized cerebral atrophy. Comparatively mild cerebellar atrophy. Mild left sphenoid sinusitis. Electronically Signed   By: Kellie Simmering D.O.   On: 06/28/2021 13:27   CT Angio Chest PE W and/or Wo Contrast  Result Date: 06/07/2021 CLINICAL DATA:  Pulmonary embolism (PE) suspected, high prob EXAM: CT ANGIOGRAPHY CHEST WITH CONTRAST TECHNIQUE: Multidetector CT imaging of the chest was performed using the standard protocol during bolus administration of intravenous contrast. Multiplanar CT image reconstructions and MIPs were obtained to evaluate the vascular anatomy. RADIATION DOSE REDUCTION: This exam was performed according to the departmental dose-optimization program which includes automated exposure control, adjustment of the mA and/or kV according to patient size and/or use of iterative reconstruction technique. CONTRAST:  29mL OMNIPAQUE IOHEXOL 350 MG/ML SOLN  COMPARISON:  CT chest August  2022, multiple priors FINDINGS: Cardiovascular: Satisfactory opacification of the pulmonary arteries to the lobar level. Motion artifacts limits evaluation of the more distal pulmonary arteries. No evidence of central pulmonary embolism. There is mediastinal shift to the right. The thoracic aorta is normal in caliber. Normal heart size. No pericardial effusion. Mediastinum/Nodes: Bulky subcarinal lymphadenopathy measuring 3.6 x 3.2 cm. The thyroid gland appears normal. Lungs/Pleura: On the left side, there is complete collapse of the left upper lobe. There is dense consolidation of the inferior left lower lobe with more patchy nodular opacities in the posterior aspect. There appears to be extensive mucous plugging of the airways with abrupt cut off of the left mainstem bronchus. In the right lung, there is emphysematous changes without appreciable consolidative process, although finer evaluation of the lungs is limited by motion artifact. Musculoskeletal: No aggressive osseous lesions. There is a left breast mass measuring 2.1 x 1.2 cm. Upper abdomen: The visualized upper abdomen is unremarkable. Review of the MIP images confirms the above findings. IMPRESSION: 1. No findings of pulmonary embolism to the level of the lobar pulmonary arteries. Evaluation of the more distal pulmonary arteries limited by motion artifact. 2. There are progressive and extensive changes in the left lung since most recent PET-CT in August 2022 demonstrated a left lower lobe pulmonary mass. There is now complete collapse of the left upper lobe. There is dense consolidation of the inferior left lower lobe along with new nodular opacities in the dependent aspect. There is abrupt cut off of the left mainstem bronchus, with essentially no aeration of the more distal bronchi. Overall, findings are assumed to represent progression of underlying malignancy although a discrete mass is not able to be measured given  extensive surrounding consolidation. Superimposed infection/aspiration is a consideration. Extensive plugging of the airways may be due to a combination of tumor infiltration and/or mucous impaction. 3. Large subcarinal lymphadenopathy, assumed to represent metastatic disease. These results were called by telephone at the time of interpretation on 06/18/2021 at 3:23 pm to provider Dr. Marylyn Ishihara, who verbally acknowledged these results. Electronically Signed   By: Albin Felling M.D.   On: 06/23/2021 15:25    Microbiology Recent Results (from the past 240 hour(s))  Resp Panel by RT-PCR (Flu A&B, Covid) Nasopharyngeal Swab     Status: None   Collection Time: 06/16/2021 12:49 PM   Specimen: Nasopharyngeal Swab; Nasopharyngeal(NP) swabs in vial transport medium  Result Value Ref Range Status   SARS Coronavirus 2 by RT PCR NEGATIVE NEGATIVE Final    Comment: (NOTE) SARS-CoV-2 target nucleic acids are NOT DETECTED.  The SARS-CoV-2 RNA is generally detectable in upper respiratory specimens during the acute phase of infection. The lowest concentration of SARS-CoV-2 viral copies this assay can detect is 138 copies/mL. A negative result does not preclude SARS-Cov-2 infection and should not be used as the sole basis for treatment or other patient management decisions. A negative result may occur with  improper specimen collection/handling, submission of specimen other than nasopharyngeal swab, presence of viral mutation(s) within the areas targeted by this assay, and inadequate number of viral copies(<138 copies/mL). A negative result must be combined with clinical observations, patient history, and epidemiological information. The expected result is Negative.  Fact Sheet for Patients:  EntrepreneurPulse.com.au  Fact Sheet for Healthcare Providers:  IncredibleEmployment.be  This test is no t yet approved or cleared by the [place] FDA and  has been authorized for  detection and/or diagnosis of SARS-CoV-2 by FDA under an Emergency Use Authorization (EUA).  This EUA will remain  in effect (meaning this test can be used) for the duration of the COVID-19 declaration under Section 564(b)(1) of the Act, 21 U.S.C.section 360bbb-3(b)(1), unless the authorization is terminated  or revoked sooner.       Influenza A by PCR NEGATIVE NEGATIVE Final   Influenza B by PCR NEGATIVE NEGATIVE Final    Comment: (NOTE) The Xpert Xpress SARS-CoV-2/FLU/RSV plus assay is intended as an aid in the diagnosis of influenza from Nasopharyngeal swab specimens and should not be used as a sole basis for treatment. Nasal washings and aspirates are unacceptable for Xpert Xpress SARS-CoV-2/FLU/RSV testing.  Fact Sheet for Patients: EntrepreneurPulse.com.au  Fact Sheet for Healthcare Providers: IncredibleEmployment.be  This test is not yet approved or cleared by the [place] FDA and has been authorized for detection and/or diagnosis of SARS-CoV-2 by FDA under an Emergency Use Authorization (EUA). This EUA will remain in effect (meaning this test can be used) for the duration of the COVID-19 declaration under Section 564(b)(1) of the Act, 21 U.S.C. section 360bbb-3(b)(1), unless the authorization is terminated or revoked.  Performed at [facility], [place] [address], [place], [place] [address]   Culture, blood (routine x 2)     Status: None (Preliminary result)   Collection Time: 06/20/2021  1:56 PM   Specimen: Right Antecubital; Blood  Result Value Ref Range Status   Specimen Description   Final    RIGHT ANTECUBITAL Performed at [facility] [address], [place], [place] [address]    Special Requests   Final    BOTTLES DRAWN AEROBIC AND ANAEROBIC Blood Culture results may not be optimal due to an excessive volume of blood received in culture bottles Performed at [facility] [address], [place], [place] [address]    Culture   Final    NO GROWTH 4 DAYS Performed at [facility], [place] [address] [place], [place] [address]    Report Status PENDING  Incomplete  Culture, blood (routine x 2)     Status: None (Preliminary result)   Collection Time: 06/23/2021  1:56 PM   Specimen: BLOOD  Result Value Ref Range Status   Specimen Description   Final    BLOOD LEFT ANTECUBITAL Performed at [facility] [address], [place], [place] [address]    Special Requests   Final    BOTTLES DRAWN AEROBIC ONLY Blood Culture results may not be optimal due to an inadequate volume of blood received in culture bottles Performed at [facility] [address], [place], [place] [address]    Culture   Final    NO GROWTH 4 DAYS Performed at [facility], [place] [address], [place], [place] [address]    Report Status PENDING  Incomplete    Lab Basic Metabolic Panel: Recent Labs  Lab 07/04/2021 1151  NA 135  K 3.8  CL 98  CO2 27  GLUCOSE 103*  BUN 40*  CREATININE 0.76  CALCIUM 13.0*   Liver Function Tests: Recent Labs  Lab 06/15/2021 1151  AST 23  ALT 14  ALKPHOS 69  BILITOT 1.4*  PROT 6.4*  ALBUMIN 2.5*   No results for input(s): LIPASE, AMYLASE in the last 168 hours. Recent Labs  Lab 06/11/2021 1249  AMMONIA 13   CBC: Recent Labs  Lab 06/25/2021 1211  WBC 19.6*  NEUTROABS 17.3*  HGB 11.9*  HCT 35.6*  MCV 97.0  PLT 464*  Cardiac Enzymes: Recent Labs  Lab 06/25/2021 1211  CKTOTAL 57   Sepsis Labs: Recent Labs  Lab 06/08/2021 1211  WBC 19.6*    Procedures/Operations  CT angiogram chest 06/30/2021 CT head without contrast 07/02/2021 Chest x-ray 06/17/2021   Irine Seal 07-04-2021, 7:30 AM

## 2021-07-05 NOTE — Progress Notes (Signed)
Time of Death 0010 am, family at the bedside  on call provider notified, called organ donation and report death to  bed placement. Post mortem flow sheet completed, body transferred to the morgue by [facility].

## 2021-07-05 DEATH — deceased

## 2022-06-13 IMAGING — CT CT CHEST W/ CM
2 of 4 series · 15 of 36 positions shown, 18 images · IV contrast (OMNIPAQUE 350)
Comparison: Radiography from earlier the same day

CLINICAL DATA: Abnormal chest x-ray.  Presentation for fall

EXAM:
CT CHEST WITH CONTRAST
TECHNIQUE: Multidetector CT imaging of the chest was performed during
intravenous contrast administration.
CONTRAST:  75mL OMNIPAQUE IOHEXOL 350 MG/ML SOLN

[Series 2: axial st · axial · 0.59mm/px · z∈[-21,+215]mm · 12 of 138 slices shown, 15 images]
[im 10/138  mediastinal]
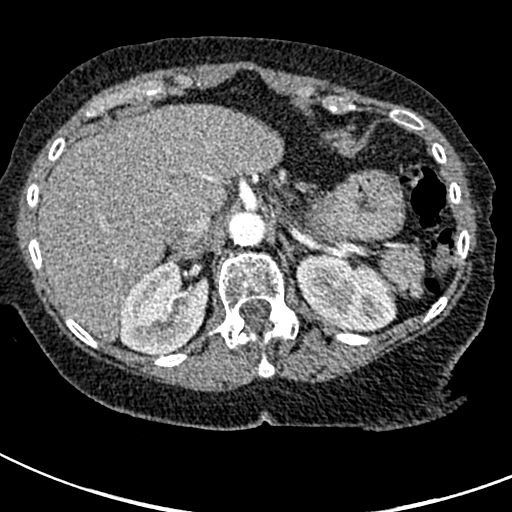
[im 10/138  lung]
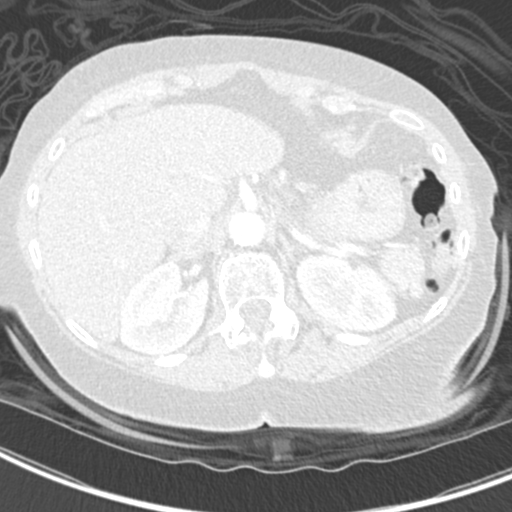
[im 20/138  lung]
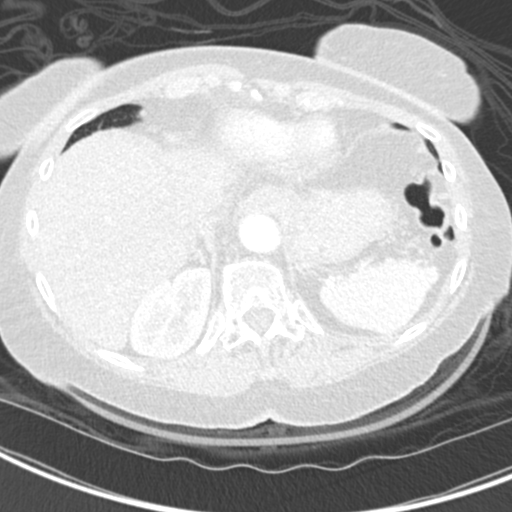
[im 30/138  lung]
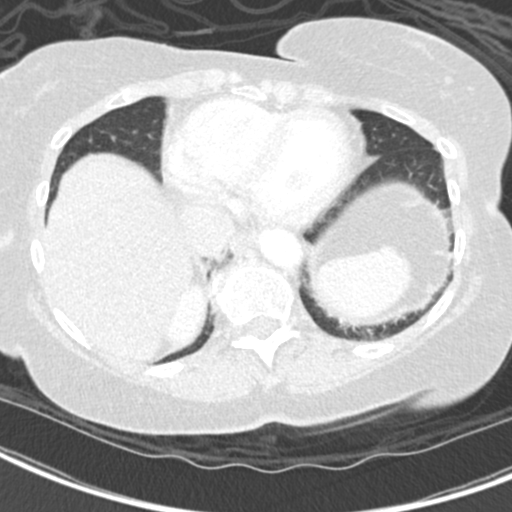
[im 40/138  lung]
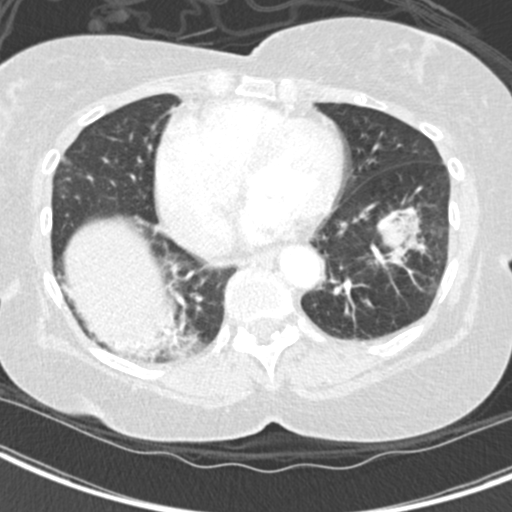
[im 49/138  mediastinal]
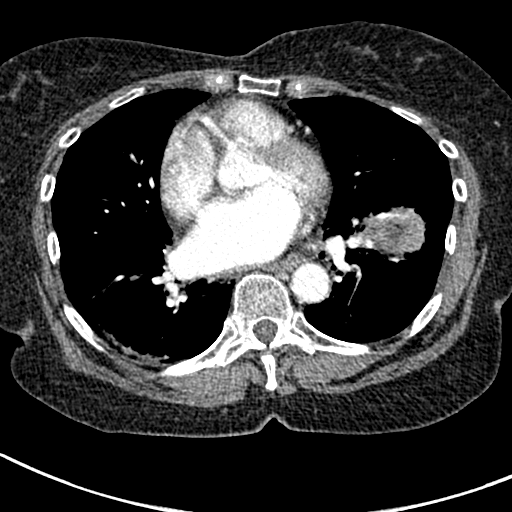
[im 49/138  lung]
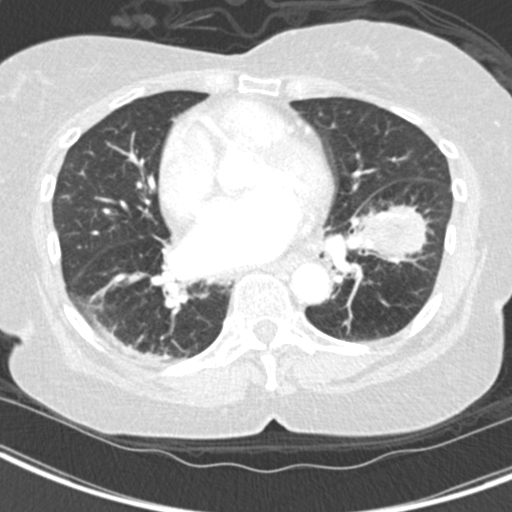
[im 59/138  lung]
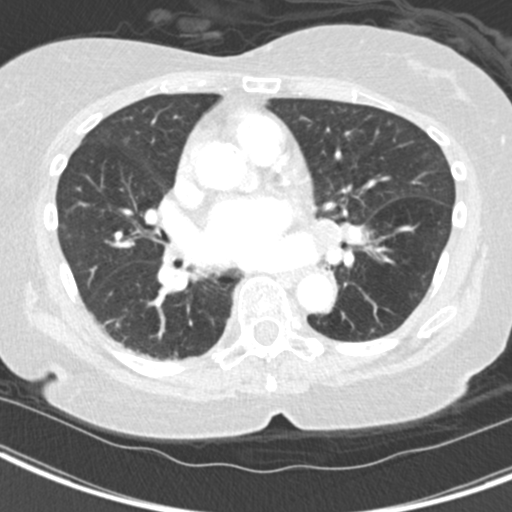
[im 79/138  lung]
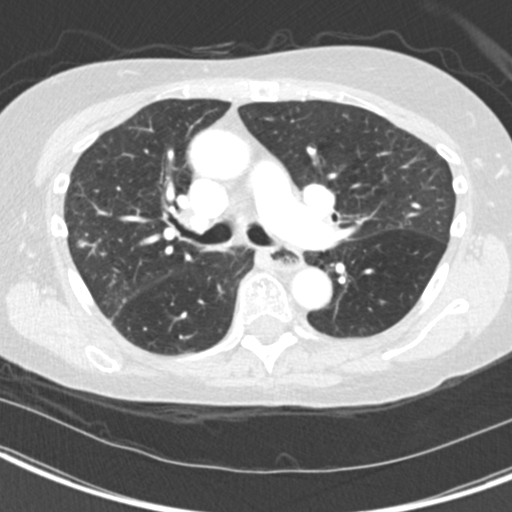
[im 89/138  lung]
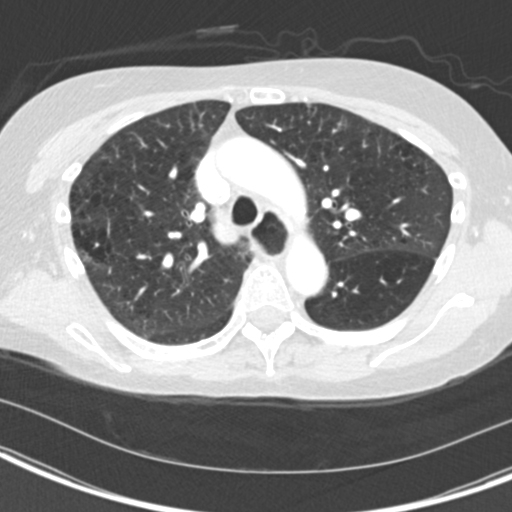
[im 98/138  mediastinal]
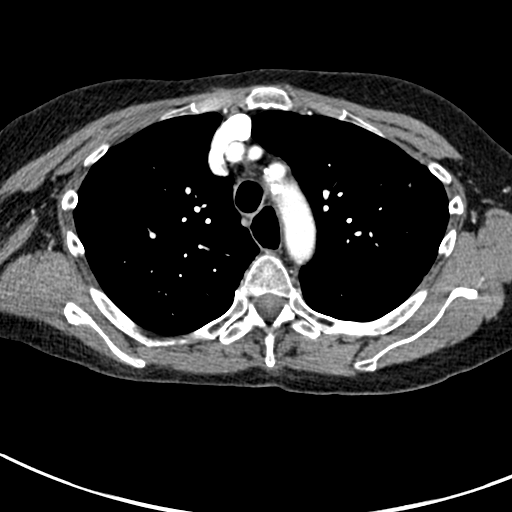
[im 98/138  lung]
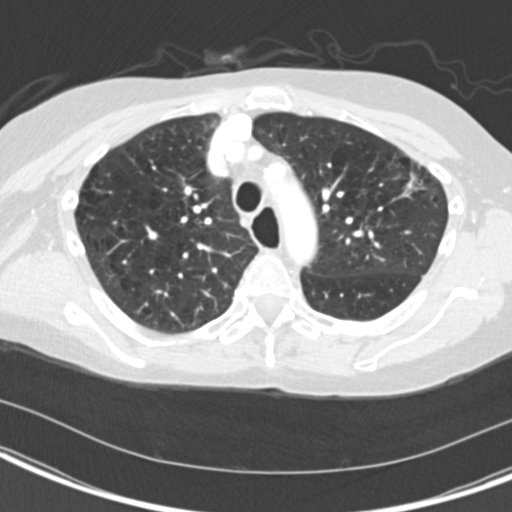
[im 108/138  lung]
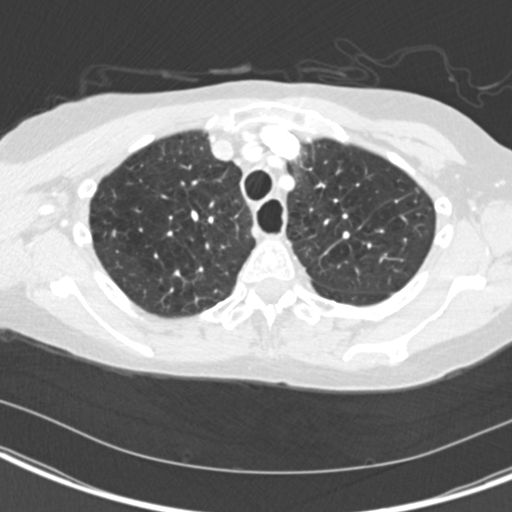
[im 118/138  lung]
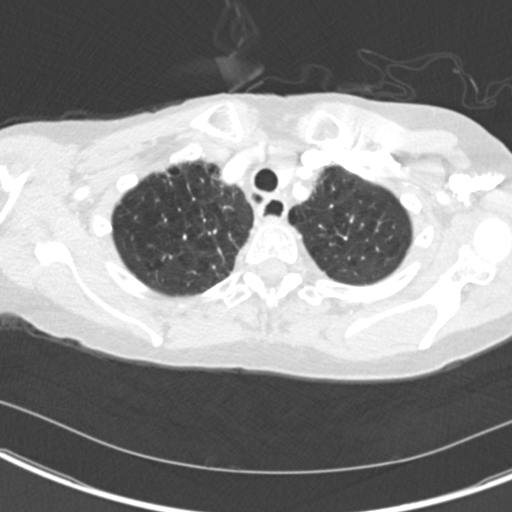
[im 128/138  lung]
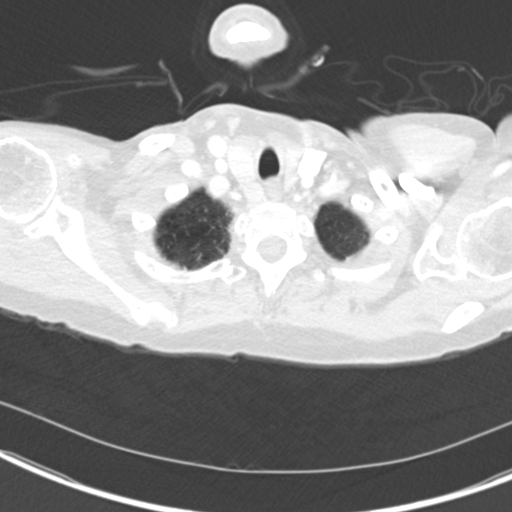

[Series 5: coronal · coronal · 0.56mm/px · 3 of 112 slices shown]
[im 23/112  lung]
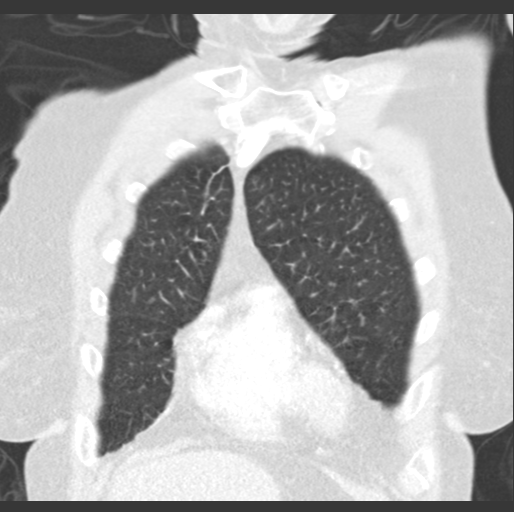
[im 45/112  lung]
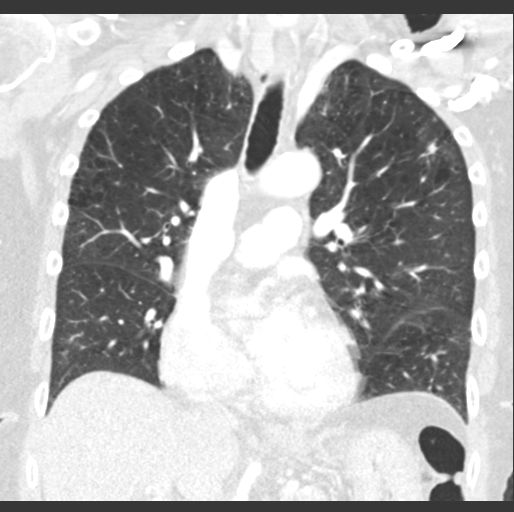
[im 67/112  lung]
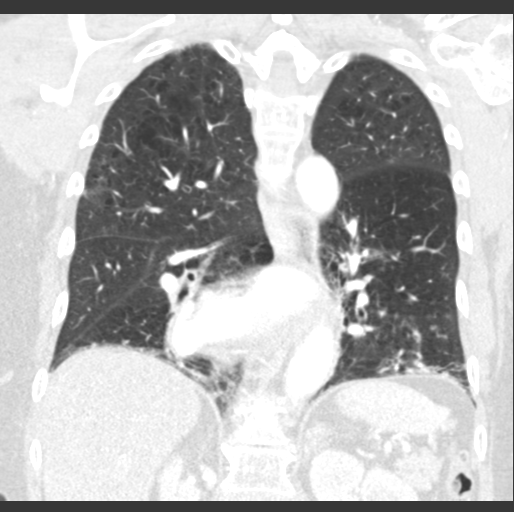

[15 of 36 positions shown; findings below may reference images not displayed]

FINDINGS: Cardiovascular: Normal heart size. No pericardial effusion. Aortic
and coronary atherosclerosis. No acute vascular finding

Mediastinum/Nodes: Heterogeneous enlargement of a left hilar node
measuring 2 cm. Nonenlarged but centrally low-density subcarinal
node measuring 15 mm.

Uncomplicated tracheal diverticulum just below the thoracic inlet.

Lungs/Pleura: Confirmed mass in the left lower lobe measuring up to
3.7 cm with central low-density appearance similar to the enlarged
nodes.

Solid and subsolid 14 mm subpleural nodule in the left upper lobe.

Solid and subsolid 12 mm nodule with adjacent solid 5 mm nodule in
the right upper lobe along the fissure.

Tiny apical pulmonary nodule is noted in the right lung.

Airway thickening with some right lower lobe mucoid impaction and
atelectasis. Centrilobular emphysema

Upper Abdomen: No evidence of mass or acute disease.

Musculoskeletal: No noted metastatic disease or fracture.
IMPRESSION: 1. 3.8 cm left lower lobe mass consistent with bronchogenic
carcinoma. There is left hilar and subcarinal adenopathy. Recommend
referral to multi disciplinary thoracic oncology service.
2. Suspicious mixed density nodules in the bilateral upper lobes.
3. Aortic Atherosclerosis (BO9TX-LH7.7) and Emphysema ([id number].R).

## 2022-08-05 IMAGING — DX DG CHEST 1V PORT
1 series · 1 of 1 positions shown · non-contrast
Comparison: Portable exam 6767 hours compared to 01/05/2021

CLINICAL DATA: Post bronchoscopy with biopsy

EXAM:
PORTABLE CHEST 1 VIEW

[chest]
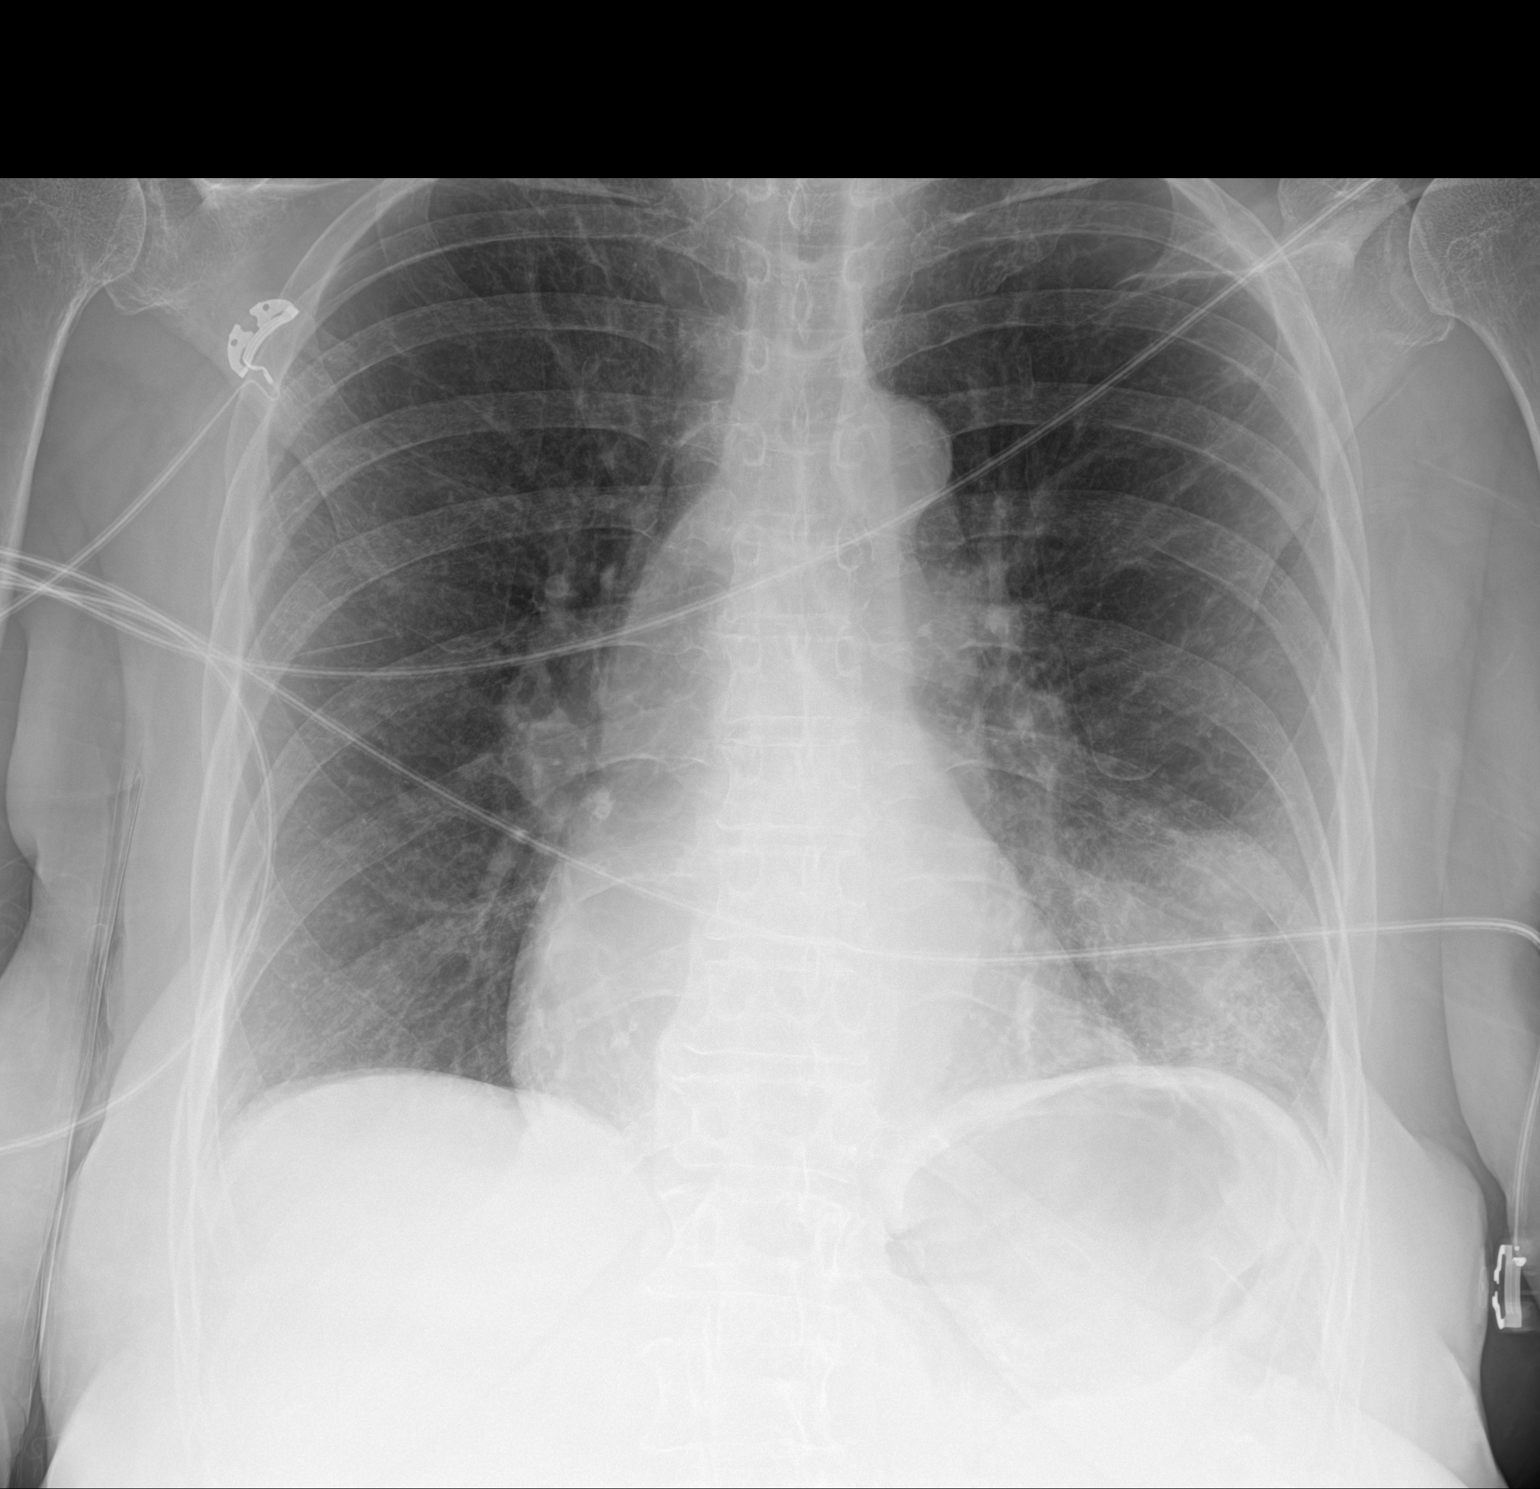

[1 of 1 positions shown; findings below may reference images not displayed]

FINDINGS: Normal heart size, mediastinal contours, and pulmonary vascularity.

Small nodular density LEFT upper lobe unchanged.

Increased infiltrates surrounding nodular density seen previously at
the LEFT base.

Central peribronchial thickening again seen.

No pleural effusion or pneumothorax.

Bones demineralized.
IMPRESSION: Stable LEFT upper lobe and LEFT basilar nodular densities with mild
surrounding infiltrate at the nodule at the LEFT base, may be
related to bronchoscopy either representing mild hemorrhage or
edema.

No pneumothorax.

## 2022-08-21 IMAGING — DX DG CHEST 2V
2 series · 2 of 2 positions shown · non-contrast
Comparison: Chest x-ray 02/27/2021.

CLINICAL DATA: 75-year-old female with history of shortness of
breath.

EXAM:
CHEST - 2 VIEW

[chest pa]
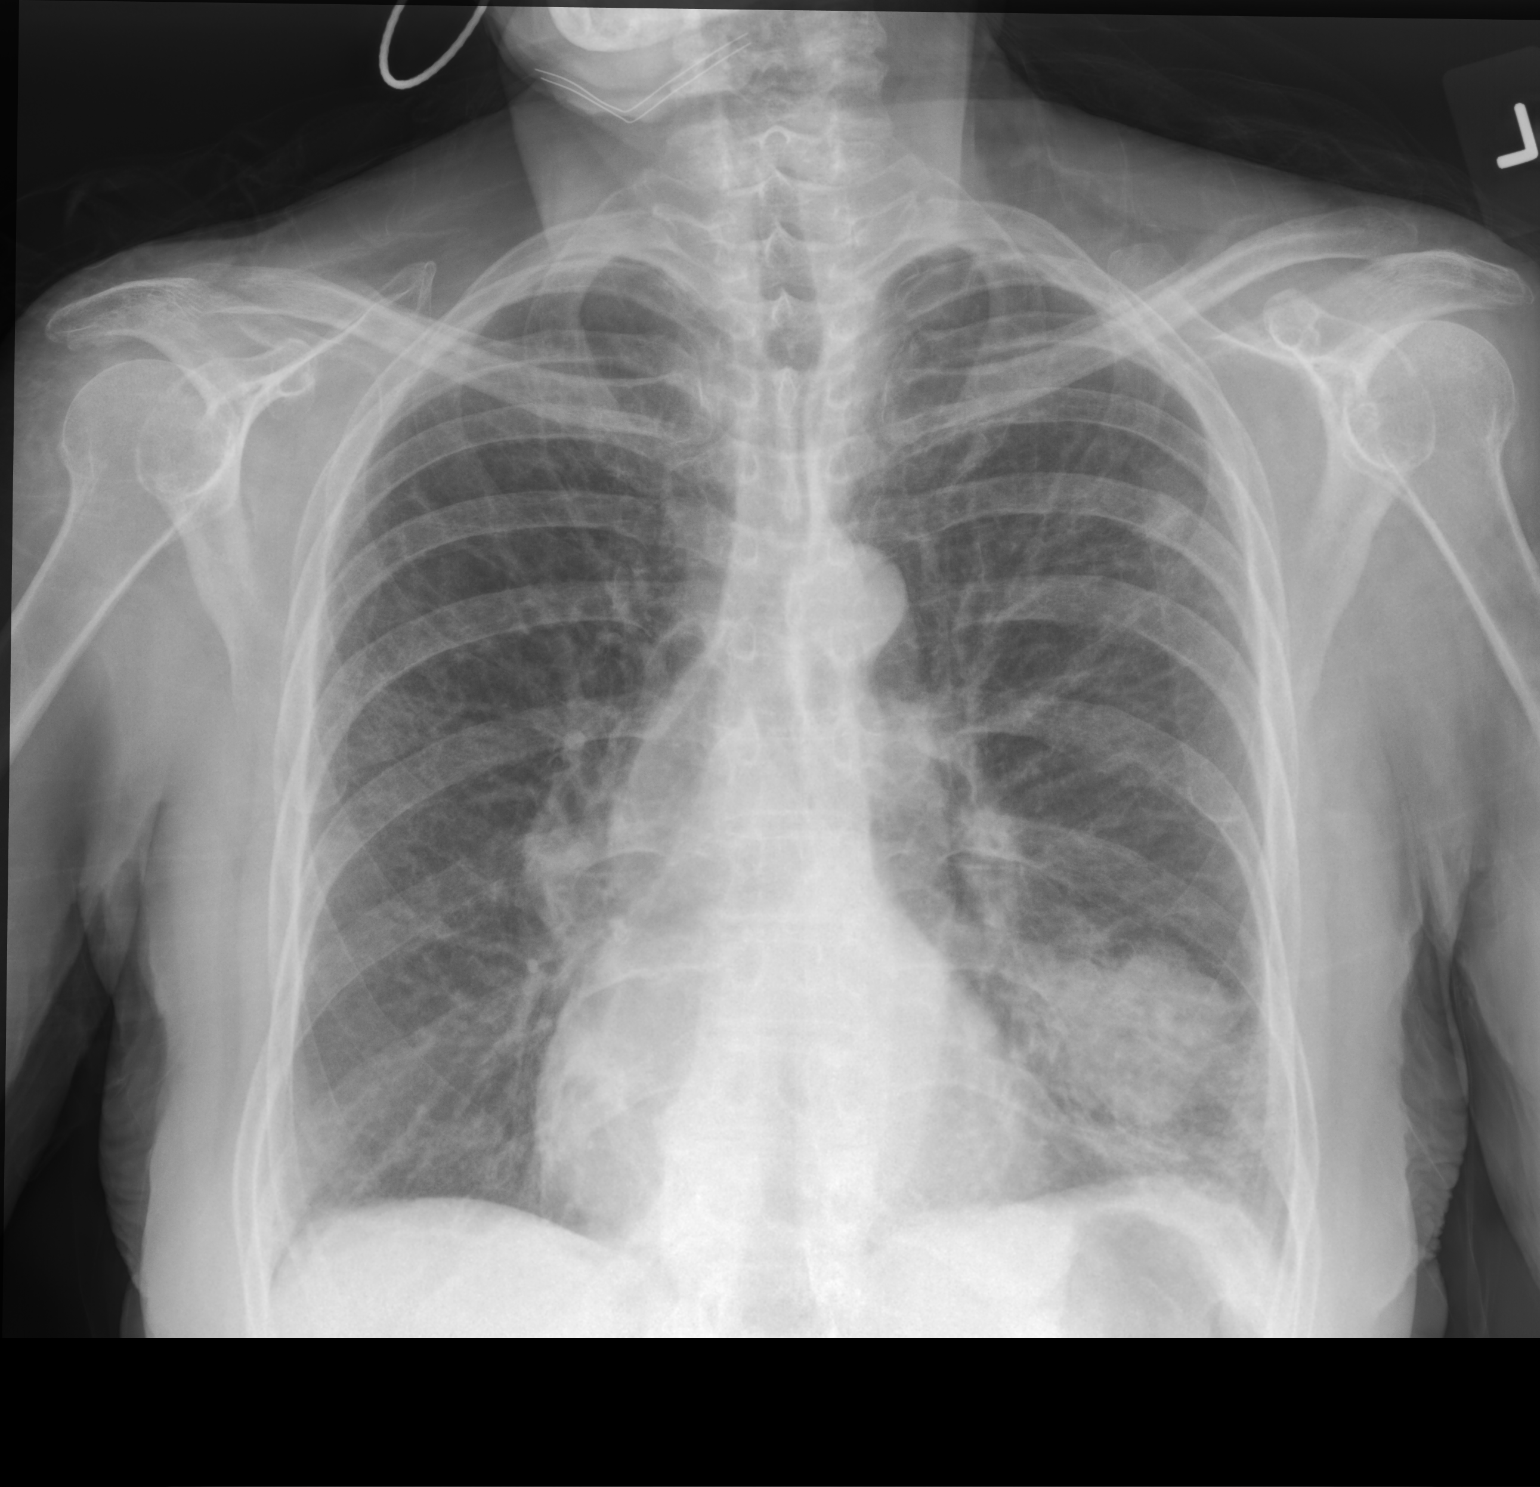

[chest lat]
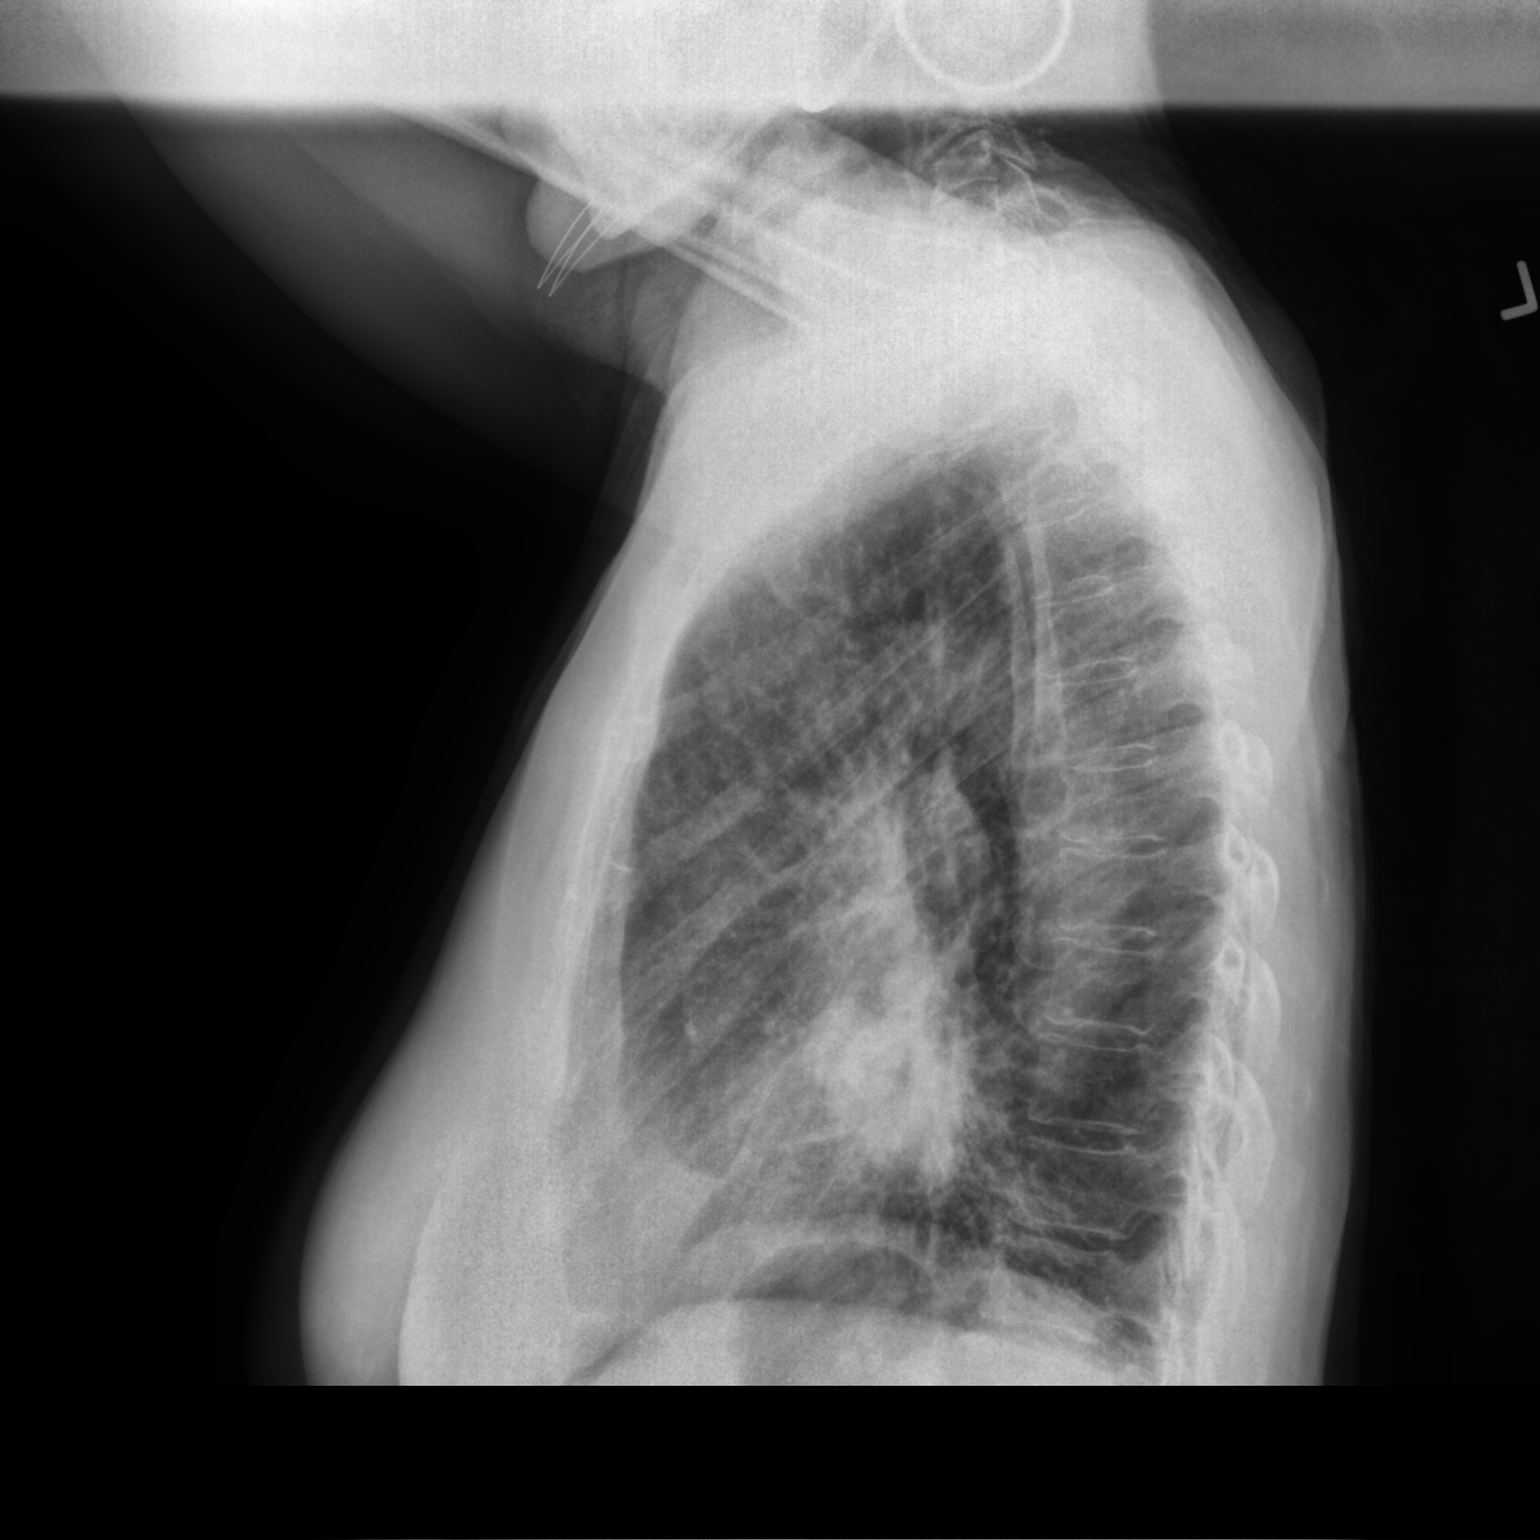

[2 of 2 positions shown; findings below may reference images not displayed]

FINDINGS: Again noted is a large mass in the lower left hemithorax which is
estimated to measure approximately 6.1 x 4.3 cm. New nodular density
in the left upper lobe measuring approximately 1.5 cm. Right lung is
clear. No pleural effusions. No pneumothorax. No evidence of
pulmonary edema. Heart size is mildly enlarged. Upper mediastinal
contours are within normal limits.
IMPRESSION: 1. Enlarging left lower lobe mass. New left upper lobe pulmonary
nodule. Further evaluation with noncontrast chest CT is suggested to
better evaluate these findings.
